# Patient Record
Sex: Male | Born: 1995
Health system: Southern US, Community
[De-identification: ages and names within clinical notes are randomized; demographics above are authoritative.]

## PROBLEM LIST (undated history)

## (undated) DIAGNOSIS — I889 Nonspecific lymphadenitis, unspecified: Principal | ICD-10-CM

## (undated) DIAGNOSIS — A749 Chlamydial infection, unspecified: Secondary | ICD-10-CM

## (undated) DIAGNOSIS — L259 Unspecified contact dermatitis, unspecified cause: Secondary | ICD-10-CM

## (undated) DIAGNOSIS — R634 Abnormal weight loss: Secondary | ICD-10-CM

## (undated) DIAGNOSIS — L239 Allergic contact dermatitis, unspecified cause: Secondary | ICD-10-CM

## (undated) DIAGNOSIS — J45909 Unspecified asthma, uncomplicated: Secondary | ICD-10-CM

## (undated) DIAGNOSIS — Z8619 Personal history of other infectious and parasitic diseases: Secondary | ICD-10-CM

## (undated) DIAGNOSIS — M25512 Pain in left shoulder: Principal | ICD-10-CM

## (undated) DIAGNOSIS — B20 Human immunodeficiency virus [HIV] disease: Principal | ICD-10-CM

## (undated) DIAGNOSIS — T7840XA Allergy, unspecified, initial encounter: Secondary | ICD-10-CM

## (undated) DIAGNOSIS — Z21 Asymptomatic human immunodeficiency virus [HIV] infection status: Secondary | ICD-10-CM

## (undated) HISTORY — DX: Personal history of other infectious and parasitic diseases: Z86.19

## (undated) HISTORY — DX: Allergic contact dermatitis, unspecified cause: L23.9

## (undated) HISTORY — PX: WISDOM TOOTH EXTRACTION: SHX21

## (undated) HISTORY — DX: Human immunodeficiency virus (HIV) disease: B20

## (undated) HISTORY — DX: Pain in left shoulder: M25.512

## (undated) HISTORY — DX: Unspecified asthma, uncomplicated: J45.909

## (undated) HISTORY — DX: Chlamydial infection, unspecified: A74.9

## (undated) HISTORY — DX: Allergy, unspecified, initial encounter: T78.40XA

## (undated) HISTORY — DX: Unspecified contact dermatitis, unspecified cause: L25.9

## (undated) HISTORY — DX: Abnormal weight loss: R63.4

## (undated) HISTORY — DX: Asymptomatic human immunodeficiency virus (hiv) infection status: Z21

## (undated) HISTORY — DX: Nonspecific lymphadenitis, unspecified: I88.9

---
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
## 2014-02-01 ENCOUNTER — Emergency Department (HOSPITAL_COMMUNITY)
Admission: EM | Admit: 2014-02-01 | Discharge: 2014-02-01 | Disposition: A | Discharge status: Home / Self Care | Payer: 59 | Attending: Emergency Medicine | Admitting: Emergency Medicine

## 2014-02-01 ENCOUNTER — Encounter (HOSPITAL_COMMUNITY): Payer: Self-pay | Admitting: *Deleted

## 2014-02-01 DIAGNOSIS — J45909 Unspecified asthma, uncomplicated: Secondary | ICD-10-CM | POA: Diagnosis not present

## 2014-02-01 DIAGNOSIS — R197 Diarrhea, unspecified: Secondary | ICD-10-CM | POA: Diagnosis present

## 2014-02-01 DIAGNOSIS — B349 Viral infection, unspecified: Secondary | ICD-10-CM

## 2014-02-01 HISTORY — DX: Unspecified asthma, uncomplicated: J45.909

## 2014-02-01 LAB — COMPREHENSIVE METABOLIC PANEL
ALT: 21 U/L (ref 0–53)
AST: 31 U/L (ref 0–37)
Albumin: 4 g/dL (ref 3.5–5.2)
Alkaline Phosphatase: 81 U/L (ref 39–117)
Anion gap: 16 — ABNORMAL HIGH (ref 5–15)
BILIRUBIN TOTAL: 1 mg/dL (ref 0.3–1.2)
BUN: 18 mg/dL (ref 6–23)
CHLORIDE: 98 meq/L (ref 96–112)
CO2: 26 meq/L (ref 19–32)
CREATININE: 1.16 mg/dL (ref 0.50–1.35)
Calcium: 8.6 mg/dL (ref 8.4–10.5)
GFR calc Af Amer: >90 mL/min (ref >90)
Glucose, Bld: 96 mg/dL (ref 70–99)
Potassium: 3.9 mEq/L (ref 3.7–5.3)
Sodium: 140 mEq/L (ref 137–147)
Total Protein: 7.6 g/dL (ref 6.0–8.3)

## 2014-02-01 LAB — CBC WITH DIFFERENTIAL/PLATELET
BASOS ABS: 0 10*3/uL (ref 0.0–0.1)
Basophils Relative: 0 % (ref 0–1)
Eosinophils Absolute: 0 10*3/uL (ref 0.0–0.7)
Eosinophils Relative: 0 % (ref 0–5)
HEMATOCRIT: 48.2 % (ref 39.0–52.0)
HEMOGLOBIN: 16.4 g/dL (ref 13.0–17.0)
LYMPHS ABS: 2.3 10*3/uL (ref 0.7–4.0)
LYMPHS PCT: 55 % — AB (ref 12–46)
MCH: 29.8 pg (ref 26.0–34.0)
MCHC: 34 g/dL (ref 30.0–36.0)
MCV: 87.6 fL (ref 78.0–100.0)
MONO ABS: 0.3 10*3/uL (ref 0.1–1.0)
MONOS PCT: 6 % (ref 3–12)
NEUTROS ABS: 1.7 10*3/uL (ref 1.7–7.7)
Neutrophils Relative %: 39 % — ABNORMAL LOW (ref 43–77)
Platelets: 167 10*3/uL (ref 150–400)
RBC: 5.5 MIL/uL (ref 4.22–5.81)
RDW: 13.7 % (ref 11.5–15.5)
WBC: 4.3 10*3/uL (ref 4.0–10.5)

## 2014-02-01 LAB — RAPID STREP SCREEN (MED CTR MEBANE ONLY): Streptococcus, Group A Screen (Direct): NEGATIVE

## 2014-02-01 LAB — LIPASE, BLOOD: Lipase: 55 U/L (ref 11–59)

## 2014-02-01 LAB — URINALYSIS, ROUTINE W REFLEX MICROSCOPIC
Bilirubin Urine: NEGATIVE
GLUCOSE, UA: NEGATIVE mg/dL
Hgb urine dipstick: NEGATIVE
KETONES UR: 15 mg/dL — AB
LEUKOCYTES UA: NEGATIVE
Nitrite: NEGATIVE
PROTEIN: 100 mg/dL — AB
Specific Gravity, Urine: 1.036 — ABNORMAL HIGH (ref 1.005–1.030)
Urobilinogen, UA: 0.2 mg/dL (ref 0.0–1.0)
pH: 6 (ref 5.0–8.0)

## 2014-02-01 LAB — URINE MICROSCOPIC-ADD ON

## 2014-02-01 MED ORDER — KETOROLAC TROMETHAMINE 30 MG/ML IJ SOLN
30.0000 mg | Freq: Once | INTRAMUSCULAR | Status: AC
  Administered 2014-02-01: 30 mg via INTRAVENOUS
  Filled 2014-02-01: qty 1

## 2014-02-01 MED ORDER — IBUPROFEN 800 MG PO TABS: 800.0000 mg | ORAL_TABLET | Freq: Three times a day (TID) | ORAL | Status: DC

## 2014-02-01 MED ORDER — ONDANSETRON 4 MG PO TBDP: 4.0000 mg | ORAL_TABLET | Freq: Every day | ORAL | Status: DC | PRN

## 2014-02-01 MED ORDER — ONDANSETRON HCL 4 MG/2ML IJ SOLN
4.0000 mg | Freq: Once | INTRAMUSCULAR | Status: AC
  Administered 2014-02-01: 4 mg via INTRAVENOUS
  Filled 2014-02-01: qty 2

## 2014-02-01 NOTE — ED Notes (Signed)
Pt reports headache, nasal congestion, fever of 102 at home. Pt reports diarrhea x 3 and constipation. Last BM today and normal. Pt reports one BM a day. Reports emesis x 4 in three days. Pt took 1 g of tylenol around 2100 tonight.

## 2014-02-01 NOTE — ED Provider Notes (Signed)
CSN: [id number]     Arrival date & time 02/01/14  0005 History   First MD Initiated Contact with Patient 02/01/14 [phone]     Chief Complaint  Patient presents with  . Headache  . Nasal Congestion  . Emesis  . Diarrhea     (Consider location/radiation/quality/duration/timing/severity/associated sxs/prior Treatment) HPI   [name] is a 18 year old male coming in with multiple complaints. Patient states the last 3 days he's had abdominal pain, headache, fever, nausea, vomiting, diarrhea. Patient's denying any cough or sick contacts. He states his fevers as high as 102. His abdominal pain is diffuse and he describes it as nausea. There is no radiation. He's had about 2 episodes of vomiting per day.  He has had loose stools as well. He denies any dysuria or hematuria or penile discharge. Patient overall does not feel well, he has no further complaints.  10 Systems reviewed and are negative for acute change except as noted in the HPI.   Past Medical History  Diagnosis Date  . Asthma    No past surgical history on file. No family history on file. History  Substance Use Topics  . Smoking status: Never Smoker   . Smokeless tobacco: Never Used  . Alcohol Use: No    Review of Systems    Allergies  Review of patient's allergies indicates no known allergies.  Home Medications   Prior to Admission medications   Not on File   BP 109/68 mmHg  Pulse 86  Temp(Src) 99 F (37.2 C) (Oral)  Resp 14  Ht 5\' 7"  (1.702 m)  Wt 154 lb (69.854 kg)  BMI 24.11 kg/m2  SpO2 97% Physical Exam  Constitutional: He is oriented to person, place, and time. Vital signs are normal. He appears well-developed and well-nourished.  Non-toxic appearance. He does not appear ill. No distress.  HENT:  Head: Normocephalic and atraumatic.  Nose: Nose normal.  Mouth/Throat: Oropharynx is clear and moist. No oropharyngeal exudate.  Eyes: Conjunctivae and EOM are normal. Pupils are equal, round, and reactive  to light. No scleral icterus.  Neck: Normal range of motion. Neck supple. No tracheal deviation, no edema, no erythema and normal range of motion present. No thyroid mass and no thyromegaly present.  Cardiovascular: Normal rate, regular rhythm, S1 normal, S2 normal, normal heart sounds, intact distal pulses and normal pulses.  Exam reveals no gallop and no friction rub.   No murmur heard. Pulses:      Radial pulses are 2+ on the right side, and 2+ on the left side.       Dorsalis pedis pulses are 2+ on the right side, and 2+ on the left side.  Pulmonary/Chest: Effort normal and breath sounds normal. No respiratory distress. He has no wheezes. He has no rhonchi. He has no rales.  Abdominal: Soft. Normal appearance and bowel sounds are normal. He exhibits no distension, no ascites and no mass. There is no hepatosplenomegaly. There is no tenderness. There is no rebound, no guarding and no CVA tenderness.  Musculoskeletal: Normal range of motion. He exhibits no edema or tenderness.  Lymphadenopathy:    He has no cervical adenopathy.  Neurological: He is alert and oriented to person, place, and time. He has normal strength. No cranial nerve deficit or sensory deficit. He exhibits normal muscle tone. GCS eye subscore is 4. GCS verbal subscore is 5. GCS motor subscore is 6.  Skin: Skin is warm, dry and intact. No petechiae and no rash noted. He is not  diaphoretic. No erythema. No pallor.  Psychiatric: He has a normal mood and affect. His behavior is normal. Judgment normal.  Nursing note and vitals reviewed.   ED Course  Procedures (including critical care time) Labs Review Labs Reviewed  CBC WITH DIFFERENTIAL - Abnormal; Notable for the following:    Neutrophils Relative % 39 (*)    Lymphocytes Relative 55 (*)    All other components within normal limits  COMPREHENSIVE METABOLIC PANEL - Abnormal; Notable for the following:    Anion gap 16 (*)    All other components within normal limits   URINALYSIS, ROUTINE W REFLEX MICROSCOPIC - Abnormal; Notable for the following:    Color, Urine AMBER (*)    Specific Gravity, Urine 1.036 (*)    Ketones, ur 15 (*)    Protein, ur 100 (*)    All other components within normal limits  URINE MICROSCOPIC-ADD ON - Abnormal; Notable for the following:    Bacteria, UA FEW (*)    All other components within normal limits  RAPID STREP SCREEN  CULTURE, GROUP A STREP  LIPASE, BLOOD    Imaging Review No results found.   EKG Interpretation   Date/Time:  Saturday February 01 2014 02:17:21 EST Ventricular Rate:  86 PR Interval:  161 QRS Duration: 95 QT Interval:  360 QTC Calculation: 430 R Axis:   84 Text Interpretation:  Sinus rhythm RSR' in V1 or V2, probably normal  variant ST elev, probable normal early repol pattern T wave inversion lead  3 Confirmed by Erroll Lunani, Jermale Crass Ayokunle [phone]([id number]) on 02/01/2014 2:51:02 AM      MDM   Final diagnoses:  None    Patient since emergency department for multiple complaints. This is likely a viral syndrome. Blood work was obtained which did not reveal any acute cause of his symptoms. Given Toradol and Zofran emergency department for treatment. I checked his temperature in the room and it was 100.5.  Upon my repeat evaluation the patient states his symptoms has improved, nausea is resolved. He is no longer febrile. He was told this is likely a viral syndrome he needs to follow-up with the primary care physician within 3 days.His vital signs remain within normal limits and he is safe for discharge.    Tomasita CrumbleAdeleke Arbutus Nelligan, MD 02/01/14 [phone]

## 2014-02-01 NOTE — Discharge Instructions (Signed)
Viral Infections [name], You were seen today for multiple complaints. Family viral syndrome. Follow-up with a primary care physician within 3 days for continued treatment.continue to take Tylenol and Motrin as needed at home for fever and pain. If any of your symptoms worsen come back to the emergency department immediately for repeat evaluation. Thank you. A virus is a type of germ. Viruses can cause:  Minor sore throats.  Aches and pains.  Headaches.  Runny nose.  Rashes.  Watery eyes.  Tiredness.  Coughs.  Loss of appetite.  Feeling sick to your stomach (nausea).  Throwing up (vomiting).  Watery poop (diarrhea). HOME CARE   Only take medicines as told by your doctor.  Drink enough water and fluids to keep your pee (urine) clear or pale yellow. Sports drinks are a good choice.  Get plenty of rest and eat healthy. Soups and broths with crackers or rice are fine. GET HELP RIGHT AWAY IF:   You have a very bad headache.  You have shortness of breath.  You have chest pain or neck pain.  You have an unusual rash.  You cannot stop throwing up.  You have watery poop that does not stop.  You cannot keep fluids down.  You or your child has a temperature by mouth above 102 F (38.9 C), not controlled by medicine.  Your baby is older than 3 months with a rectal temperature of 102 F (38.9 C) or higher.  Your baby is 463 months old or younger with a rectal temperature of 100.4 F (38 C) or higher. MAKE SURE YOU:   Understand these instructions.  Will watch this condition.  Will get help right away if you are not doing well or get worse. Document Released: 02/18/2008 Document Revised: 05/30/2011 Document Reviewed: 07/13/2010 [facility] Patient Information 2015 [employer], [place]. This information is not intended to replace advice given to you by your health care provider. Make sure you discuss any questions you have with your health care provider.

## 2014-02-03 ENCOUNTER — Emergency Department (HOSPITAL_COMMUNITY): Payer: 59

## 2014-02-03 ENCOUNTER — Encounter (HOSPITAL_COMMUNITY): Payer: Self-pay | Admitting: *Deleted

## 2014-02-03 ENCOUNTER — Emergency Department (HOSPITAL_COMMUNITY)
Admission: EM | Admit: 2014-02-03 | Discharge: 2014-02-03 | Disposition: A | Discharge status: Home / Self Care | Payer: 59 | Attending: Emergency Medicine | Admitting: Emergency Medicine

## 2014-02-03 DIAGNOSIS — R112 Nausea with vomiting, unspecified: Secondary | ICD-10-CM | POA: Diagnosis present

## 2014-02-03 DIAGNOSIS — Z791 Long term (current) use of non-steroidal anti-inflammatories (NSAID): Secondary | ICD-10-CM | POA: Insufficient documentation

## 2014-02-03 DIAGNOSIS — J159 Unspecified bacterial pneumonia: Secondary | ICD-10-CM | POA: Insufficient documentation

## 2014-02-03 DIAGNOSIS — J45909 Unspecified asthma, uncomplicated: Secondary | ICD-10-CM | POA: Diagnosis not present

## 2014-02-03 DIAGNOSIS — Z79899 Other long term (current) drug therapy: Secondary | ICD-10-CM | POA: Diagnosis not present

## 2014-02-03 DIAGNOSIS — R05 Cough: Secondary | ICD-10-CM

## 2014-02-03 DIAGNOSIS — J189 Pneumonia, unspecified organism: Secondary | ICD-10-CM

## 2014-02-03 DIAGNOSIS — R059 Cough, unspecified: Secondary | ICD-10-CM

## 2014-02-03 LAB — BASIC METABOLIC PANEL
ANION GAP: 14 (ref 5–15)
BUN: 16 mg/dL (ref 6–23)
CALCIUM: 8.7 mg/dL (ref 8.4–10.5)
CO2: 24 mEq/L (ref 19–32)
CREATININE: 0.96 mg/dL (ref 0.50–1.35)
Chloride: 99 mEq/L (ref 96–112)
GFR calc non Af Amer: >90 mL/min (ref >90)
Glucose, Bld: 94 mg/dL (ref 70–99)
Potassium: 4.2 mEq/L (ref 3.7–5.3)
Sodium: 137 mEq/L (ref 137–147)

## 2014-02-03 LAB — CBC WITH DIFFERENTIAL/PLATELET
BASOS ABS: 0.1 10*3/uL (ref 0.0–0.1)
BASOS PCT: 2 % — AB (ref 0–1)
EOS ABS: 0 10*3/uL (ref 0.0–0.7)
EOS PCT: 0 % (ref 0–5)
HEMATOCRIT: 46.8 % (ref 39.0–52.0)
Hemoglobin: 16.2 g/dL (ref 13.0–17.0)
Lymphocytes Relative: 50 % — ABNORMAL HIGH (ref 12–46)
Lymphs Abs: 2.3 10*3/uL (ref 0.7–4.0)
MCH: 29.2 pg (ref 26.0–34.0)
MCHC: 34.6 g/dL (ref 30.0–36.0)
MCV: 84.5 fL (ref 78.0–100.0)
MONO ABS: 0.4 10*3/uL (ref 0.1–1.0)
Monocytes Relative: 9 % (ref 3–12)
Neutro Abs: 1.8 10*3/uL (ref 1.7–7.7)
Neutrophils Relative %: 39 % — ABNORMAL LOW (ref 43–77)
Platelets: 116 10*3/uL — ABNORMAL LOW (ref 150–400)
RBC: 5.54 MIL/uL (ref 4.22–5.81)
RDW: 13.6 % (ref 11.5–15.5)
WBC: 4.6 10*3/uL (ref 4.0–10.5)

## 2014-02-03 LAB — CULTURE, GROUP A STREP

## 2014-02-03 MED ORDER — METOCLOPRAMIDE HCL 5 MG/ML IJ SOLN
10.0000 mg | Freq: Once | INTRAMUSCULAR | Status: AC
  Administered 2014-02-03: 10 mg via INTRAVENOUS
  Filled 2014-02-03: qty 2

## 2014-02-03 MED ORDER — SODIUM CHLORIDE 0.9 % IV BOLUS (SEPSIS): 1000.0000 mL | Freq: Once | INTRAVENOUS | Status: DC

## 2014-02-03 MED ORDER — SODIUM CHLORIDE 0.9 % IV BOLUS (SEPSIS)
1000.0000 mL | Freq: Once | INTRAVENOUS | Status: AC
  Administered 2014-02-03: 1000 mL via INTRAVENOUS

## 2014-02-03 MED ORDER — METOCLOPRAMIDE HCL 10 MG PO TABS: 10.0000 mg | ORAL_TABLET | Freq: Four times a day (QID) | ORAL | Status: DC | PRN

## 2014-02-03 MED ORDER — AZITHROMYCIN 250 MG PO TABS
500.0000 mg | ORAL_TABLET | Freq: Once | ORAL | Status: AC
  Administered 2014-02-03: 500 mg via ORAL
  Filled 2014-02-03: qty 2

## 2014-02-03 MED ORDER — AZITHROMYCIN 250 MG PO TABS: 250.0000 mg | ORAL_TABLET | Freq: Every day | ORAL | Status: DC

## 2014-02-03 MED ORDER — DEXTROSE 5 % IV SOLN
1.0000 g | Freq: Once | INTRAVENOUS | Status: AC
  Administered 2014-02-03: 1 g via INTRAVENOUS
  Filled 2014-02-03: qty 10

## 2014-02-03 NOTE — ED Notes (Signed)
PA Rob at the bedside 

## 2014-02-03 NOTE — ED Provider Notes (Signed)
CSN: [id number]     Arrival date & time 02/03/14  1623 History   First MD Initiated Contact with Patient 02/03/14 1809     Chief Complaint  Patient presents with  . Emesis     (Consider location/radiation/quality/duration/timing/severity/associated sxs/prior Treatment) HPI Comments: Patient with history of asthma, presents to the emergency department with chief complaints of cough, nausea, and vomiting. Patient states that he was recently seen and diagnosed with URI. Patient states that stay started to notice blood streaks in his vomit. He also reports persistent cough. He endorses fever to 102. He has not taken anything to alleviate his symptoms. There are no aggravating or alleviating factors.   The history is provided by the patient. No language interpreter was used.    Past Medical History  Diagnosis Date  . Asthma    History reviewed. No pertinent past surgical history. History reviewed. No pertinent family history. History  Substance Use Topics  . Smoking status: Never Smoker   . Smokeless tobacco: Never Used  . Alcohol Use: No    Review of Systems  Constitutional: Positive for fever and chills.  HENT: Positive for postnasal drip, rhinorrhea, sinus pressure, sneezing and sore throat.   Respiratory: Positive for cough. Negative for shortness of breath.   Cardiovascular: Negative for chest pain.  Gastrointestinal: Positive for nausea and vomiting. Negative for abdominal pain, diarrhea and constipation.  Genitourinary: Negative for dysuria.  All other systems reviewed and are negative.     Allergies  Review of patient's allergies indicates no known allergies.  Home Medications   Prior to Admission medications   Medication Sig Start Date End Date Taking? Authorizing Provider  ibuprofen (ADVIL,MOTRIN) 800 MG tablet Take 1 tablet (800 mg total) by mouth 3 (three) times daily. 02/01/14  Yes Tomasita Crumble, MD  mometasone (ASMANEX) 220 MCG/INH inhaler Inhale 2 puffs into  the lungs daily as needed (shortness of breath).   Yes Historical Provider, MD  montelukast (SINGULAIR) 10 MG tablet Take 10 mg by mouth at bedtime.   Yes Historical Provider, MD  ondansetron (ZOFRAN ODT) 4 MG disintegrating tablet Take 1 tablet (4 mg total) by mouth daily as needed for nausea. 02/01/14  Yes Tomasita Crumble, MD  cetirizine (ZYRTEC) 10 MG tablet Take 10 mg by mouth daily.    Historical Provider, MD   BP 94/64 mmHg  Pulse 87  Temp(Src) 98.1 F (36.7 C) (Oral)  Resp 15  SpO2 94% Physical Exam  Constitutional: He is oriented to person, place, and time. He appears well-developed and well-nourished.  HENT:  Head: Normocephalic and atraumatic.  Oropharynx is clear, TMs are clear bilaterally  Eyes: Conjunctivae and EOM are normal. Pupils are equal, round, and reactive to light. Right eye exhibits no discharge. Left eye exhibits no discharge. No scleral icterus.  Neck: Normal range of motion. Neck supple. No JVD present.  Cardiovascular: Normal rate, regular rhythm and normal heart sounds.  Exam reveals no gallop and no friction rub.   No murmur heard. Pulmonary/Chest: Effort normal and breath sounds normal. No respiratory distress. He has no wheezes. He has no rales. He exhibits no tenderness.  Clear to auscultation bilaterally  Abdominal: Soft. He exhibits no distension and no mass. There is no tenderness. There is no rebound and no guarding.  No focal abdominal tenderness, no RLQ tenderness or pain at McBurney's point, no RUQ tenderness or Murphy's sign, no left-sided abdominal tenderness, no fluid wave, or signs of peritonitis   Musculoskeletal: Normal range of motion. He exhibits  no edema or tenderness.  Neurological: He is alert and oriented to person, place, and time.  Skin: Skin is warm and dry.  Psychiatric: He has a normal mood and affect. His behavior is normal. Judgment and thought content normal.  Nursing note and vitals reviewed.   ED Course  Procedures (including  critical care time) Labs Review Results for orders placed or performed during the hospital encounter of 02/03/14  CBC with Differential  Result Value Ref Range   WBC 4.6 4.0 - 10.5 K/uL   RBC 5.54 4.22 - 5.81 MIL/uL   Hemoglobin 16.2 13.0 - 17.0 g/dL   HCT 16.146.8 09.639.0 - 04.552.0 %   MCV 84.5 78.0 - 100.0 fL   MCH 29.2 26.0 - 34.0 pg   MCHC 34.6 30.0 - 36.0 g/dL   RDW 40.913.6 81.111.5 - 91.415.5 %   Platelets 116 (L) 150 - 400 K/uL   Neutrophils Relative % 39 (L) 43 - 77 %   Neutro Abs 1.8 1.7 - 7.7 K/uL   Lymphocytes Relative 50 (H) 12 - 46 %   Lymphs Abs 2.3 0.7 - 4.0 K/uL   Monocytes Relative 9 3 - 12 %   Monocytes Absolute 0.4 0.1 - 1.0 K/uL   Eosinophils Relative 0 0 - 5 %   Eosinophils Absolute 0.0 0.0 - 0.7 K/uL   Basophils Relative 2 (H) 0 - 1 %   Basophils Absolute 0.1 0.0 - 0.1 K/uL  Basic metabolic panel  Result Value Ref Range   Sodium 137 137 - 147 mEq/L   Potassium 4.2 3.7 - 5.3 mEq/L   Chloride 99 96 - 112 mEq/L   CO2 24 19 - 32 mEq/L   Glucose, Bld 94 70 - 99 mg/dL   BUN 16 6 - 23 mg/dL   Creatinine, Ser 7.820.96 0.50 - 1.35 mg/dL   Calcium 8.7 8.4 - 95.610.5 mg/dL   GFR calc non Af Amer >90 >90 mL/min   GFR calc Af Amer >90 >90 mL/min   Anion gap 14 5 - 15   Dg Chest 2 View  02/03/2014   CLINICAL DATA:  [age] year old male with shortness of breath cough and hematemesis. left ear pain when swallowing. Recent viral syndrome. Initial encounter.  EXAM: CHEST  2 VIEW  COMPARISON:  11/30/2009.  FINDINGS: They 8 and streaky bilateral peribronchovascular opacity best seen on the PA view. No superimposed consolidation or pleural effusion. No pneumothorax or pulmonary edema. Normal cardiac size and mediastinal contours. Visualized tracheal air column is within normal limits. No pneumoperitoneum and negative visible bowel gas pattern. No osseous abnormality identified.  IMPRESSION: Vague increased bilateral peribronchovascular opacity compatible with bilateral bronchopneumonia. No associated pleural  effusion.   Electronically Signed   By: Augusto GambleLee  Hall M.D.   On: 02/03/2014 16:57     Imaging Review Dg Chest 2 View  02/03/2014   CLINICAL DATA:  18 year old male with shortness of breath cough and hematemesis. left ear pain when swallowing. Recent viral syndrome. Initial encounter.  EXAM: CHEST  2 VIEW  COMPARISON:  11/30/2009.  FINDINGS: They 8 and streaky bilateral peribronchovascular opacity best seen on the PA view. No superimposed consolidation or pleural effusion. No pneumothorax or pulmonary edema. Normal cardiac size and mediastinal contours. Visualized tracheal air column is within normal limits. No pneumoperitoneum and negative visible bowel gas pattern. No osseous abnormality identified.  IMPRESSION: Vague increased bilateral peribronchovascular opacity compatible with bilateral bronchopneumonia. No associated pleural effusion.   Electronically Signed   By: Si GaulLee  Hall M.D.  On: 02/03/2014 16:57     EKG Interpretation None      MDM   Final diagnoses:  CAP (community acquired pneumonia)    Patient with community-acquired pneumonia. Treated in ED with ceftriaxone and azithromycin. Will discharge home with Z-Pak. Return fashion skin. Patient maintains greater than 90% O2 saturation with ambulation. He is afebrile. Vital signs are stable. He is tolerating oral intake. Patient is stable and ready for discharge. He is low risk for outpatient treatment.   Roxy Horsemanobert Teller [place], PA-C 02/03/14 2007  Enid SkeensJoshua M Zavitz, MD 02/03/14 2016

## 2014-02-03 NOTE — Discharge Instructions (Signed)

## 2014-02-03 NOTE — ED Notes (Addendum)
Pt in c/o URI over the last few days, today he started noticing blood streaks in his vomiting, seen for this on 11/14, pt also reports a cough

## 2014-07-11 ENCOUNTER — Telehealth: Payer: Self-pay

## 2014-07-11 NOTE — Telephone Encounter (Signed)
Patient contacted regarding new intake appointment. Date and time given. Information given regarding documents needed to qualify for financial eligibility.  Kashton Mcartor K Izzabell Klasen, RN  

## 2014-07-22 ENCOUNTER — Other Ambulatory Visit: Payer: Self-pay | Admitting: Infectious Disease

## 2014-07-22 ENCOUNTER — Ambulatory Visit: Payer: 59

## 2014-07-22 DIAGNOSIS — B20 Human immunodeficiency virus [HIV] disease: Secondary | ICD-10-CM

## 2014-07-23 ENCOUNTER — Telehealth: Payer: Self-pay | Admitting: Infectious Disease

## 2014-07-23 DIAGNOSIS — B20 Human immunodeficiency virus [HIV] disease: Secondary | ICD-10-CM

## 2014-07-23 LAB — CBC WITH DIFFERENTIAL/PLATELET
BASOS ABS: 0.1 10*3/uL (ref 0.0–0.1)
Basophils Relative: 1 % (ref 0–1)
Eosinophils Absolute: 0.6 10*3/uL (ref 0.0–0.7)
Eosinophils Relative: 7 % — ABNORMAL HIGH (ref 0–5)
HCT: 43.9 % (ref 39.0–52.0)
HEMOGLOBIN: 14.8 g/dL (ref 13.0–17.0)
LYMPHS PCT: 38 % (ref 12–46)
Lymphs Abs: 3.3 10*3/uL (ref 0.7–4.0)
MCH: 28.3 pg (ref 26.0–34.0)
MCHC: 33.7 g/dL (ref 30.0–36.0)
MCV: 83.9 fL (ref 78.0–100.0)
MONO ABS: 0.9 10*3/uL (ref 0.1–1.0)
MPV: 10 fL (ref 8.6–12.4)
Monocytes Relative: 10 % (ref 3–12)
NEUTROS ABS: 3.8 10*3/uL (ref 1.7–7.7)
Neutrophils Relative %: 44 % (ref 43–77)
Platelets: 267 10*3/uL (ref 150–400)
RBC: 5.23 MIL/uL (ref 4.22–5.81)
RDW: 14.3 % (ref 11.5–15.5)
WBC: 8.7 10*3/uL (ref 4.0–10.5)

## 2014-07-23 LAB — LIPID PANEL
Cholesterol: 92 mg/dL (ref 0–200)
HDL: 16 mg/dL — ABNORMAL LOW (ref >=40)
Total CHOL/HDL Ratio: 5.8 Ratio
Triglycerides: 375 mg/dL — ABNORMAL HIGH (ref <150)
VLDL: 75 mg/dL — ABNORMAL HIGH (ref 0–40)

## 2014-07-23 LAB — HEPATITIS A ANTIBODY, TOTAL: Hep A Total Ab: REACTIVE — AB

## 2014-07-23 LAB — URINALYSIS
BILIRUBIN URINE: NEGATIVE
Glucose, UA: NEGATIVE mg/dL
Hgb urine dipstick: NEGATIVE
Ketones, ur: NEGATIVE mg/dL
Leukocytes, UA: NEGATIVE
NITRITE: NEGATIVE
PROTEIN: NEGATIVE mg/dL
SPECIFIC GRAVITY, URINE: 1.009 (ref 1.005–1.030)
UROBILINOGEN UA: 0.2 mg/dL (ref 0.0–1.0)
pH: 6.5 (ref 5.0–8.0)

## 2014-07-23 LAB — COMPLETE METABOLIC PANEL WITH GFR
ALT: 33 U/L (ref 0–53)
AST: 31 U/L (ref 0–37)
Albumin: 4 g/dL (ref 3.5–5.2)
Alkaline Phosphatase: 82 U/L (ref 39–117)
BILIRUBIN TOTAL: 0.6 mg/dL (ref 0.2–1.2)
BUN: 13 mg/dL (ref 6–23)
CO2: 27 meq/L (ref 19–32)
CREATININE: 0.67 mg/dL (ref 0.50–1.35)
Calcium: 8.8 mg/dL (ref 8.4–10.5)
Chloride: 104 mEq/L (ref 96–112)
GFR, Est Non African American: >89 mL/min
Glucose, Bld: 79 mg/dL (ref 70–99)
Potassium: 3.9 mEq/L (ref 3.5–5.3)
Sodium: 141 mEq/L (ref 135–145)
Total Protein: 7.2 g/dL (ref 6.0–8.3)

## 2014-07-23 LAB — HEPATITIS B SURFACE ANTIBODY,QUALITATIVE: Hep B S Ab: POSITIVE — AB

## 2014-07-23 LAB — HEPATITIS C ANTIBODY: HCV Ab: NEGATIVE

## 2014-07-23 LAB — HEPATITIS B SURFACE ANTIGEN: HEP B S AG: NEGATIVE

## 2014-07-23 LAB — HIV-1 RNA ULTRAQUANT REFLEX TO GENTYP+
HIV 1 RNA Quant: 4005643 copies/mL — ABNORMAL HIGH (ref <20)
HIV-1 RNA Quant, Log: 6.6 {Log} — ABNORMAL HIGH (ref <1.30)

## 2014-07-23 LAB — HEPATITIS B CORE ANTIBODY, TOTAL: HEP B C TOTAL AB: NONREACTIVE

## 2014-07-23 LAB — URINE CYTOLOGY ANCILLARY ONLY
Chlamydia: NEGATIVE
NEISSERIA GONORRHEA: NEGATIVE

## 2014-07-23 LAB — RPR

## 2014-07-23 NOTE — Telephone Encounter (Signed)
Can we add an HIV integrase genotype to patients labs?  He is with extremely HIGH Viral load with VL in millions.  Did he come through the [facility]?

## 2014-07-23 NOTE — Addendum Note (Signed)
Addended by: Mariea ClontsGREEN, Addalie Calles D on: 07/23/2014 02:42 PM   Modules accepted: Orders

## 2014-07-23 NOTE — Addendum Note (Signed)
Addended by: Mariea ClontsGREEN, Ayodele Sangalang D on: 07/23/2014 03:02 PM   Modules accepted: Orders

## 2014-07-24 LAB — T-HELPER CELL (CD4) - (RCID CLINIC ONLY)
CD4 % Helper T Cell: 15 % — ABNORMAL LOW (ref 33–55)
CD4 T Cell Abs: 510 /uL (ref 400–2700)

## 2014-07-24 NOTE — Addendum Note (Signed)
Addended by: Mariea ClontsGREEN, Dannette Kinkaid D on: 07/24/2014 03:04 PM   Modules accepted: Orders

## 2014-07-24 NOTE — Telephone Encounter (Signed)
Tamika can you bring me his records to look at this afternoon. I want to get a better handle on whether this is an acute case or not--if it is then he needs to be put on meds ASAP. If not we could potentially put him into one of the [place]

## 2014-07-24 NOTE — Telephone Encounter (Signed)
Yes he was a new intake

## 2014-07-25 LAB — QUANTIFERON TB GOLD ASSAY (BLOOD)
Interferon Gamma Release Assay: NEGATIVE
Mitogen value: 9.33 IU/mL
Quantiferon Nil Value: 0.11 IU/mL
Quantiferon Tb Ag Minus Nil Value: 0 IU/mL
TB Ag value: 0.11 IU/mL

## 2014-07-25 LAB — HLA B*5701: HLA-B*5701 w/rflx HLA-B High: NEGATIVE

## 2014-08-02 LAB — HIV-1 INTEGRASE GENOTYPE

## 2014-08-03 LAB — HIV-1 GENOTYPR PLUS

## 2014-08-06 ENCOUNTER — Encounter: Payer: Self-pay | Admitting: *Deleted

## 2014-08-06 ENCOUNTER — Encounter: Payer: Self-pay | Admitting: Infectious Disease

## 2014-08-06 ENCOUNTER — Ambulatory Visit (INDEPENDENT_AMBULATORY_CARE_PROVIDER_SITE_OTHER): Payer: 59 | Admitting: Infectious Disease

## 2014-08-06 VITALS — BP 124/82 | HR 85 | Temp 97.7°F | Wt 141.0 lb

## 2014-08-06 DIAGNOSIS — Z23 Encounter for immunization: Secondary | ICD-10-CM | POA: Diagnosis not present

## 2014-08-06 DIAGNOSIS — J4520 Mild intermittent asthma, uncomplicated: Secondary | ICD-10-CM

## 2014-08-06 DIAGNOSIS — J45909 Unspecified asthma, uncomplicated: Secondary | ICD-10-CM

## 2014-08-06 DIAGNOSIS — B20 Human immunodeficiency virus [HIV] disease: Secondary | ICD-10-CM | POA: Diagnosis not present

## 2014-08-06 HISTORY — DX: Human immunodeficiency virus (HIV) disease: B20

## 2014-08-06 HISTORY — DX: Unspecified asthma, uncomplicated: J45.909

## 2014-08-06 MED ORDER — ABACAVIR-DOLUTEGRAVIR-LAMIVUD 600-50-300 MG PO TABS: 1.0000 | ORAL_TABLET | Freq: Every day | ORAL | Status: DC

## 2014-08-06 NOTE — Progress Notes (Signed)
Subjective:    Patient ID: ADETOKUNBO MCCADDEN, male    DOB: [birth year], 19 y.o.   MRN: [id number]  HPI  Mr. Blaylock is a 19 year old African-American male who has been recently diagnosed with HIV disease. It appears he was seen in November for a flulike illness but not tested for HIV at that time he was tested ultimately in April and found to be HIV by fourth generation assay with discriminatory test being positive. Here in our clinic his viral load was found to be above 4 million copies per milliliter of blood. CD4 count was still healthy above 500.  His HIV integrase genotype was negative for mutations as was his conventional genotype. He is HLA [id number] was negative and he does not have hepatitis B.  We had extensive discussions with regards to antiretroviral drugs today. I gave him a comprehensive and exhaustive explanation and review of 2 potential clinical trials offered via [facility] that that he is eligible for. He was interested in both of these studies but he did not want to wait the likely 2 weeks it would take 4 screening labs to come back and 1-2 weeks after his being seen for entry. Instead he wished to start on anti-retroviral medications today which I think is highly understandable given in particular how high his viral load is.   Review of Systems  Constitutional: Negative for fever, chills, diaphoresis, activity change, appetite change, fatigue and unexpected weight change.  HENT: Negative for congestion, rhinorrhea, sinus pressure, sneezing, sore throat and trouble swallowing.   Eyes: Negative for photophobia and visual disturbance.  Respiratory: Negative for cough, chest tightness, shortness of breath, wheezing and stridor.   Cardiovascular: Negative for chest pain, palpitations and leg swelling.  Gastrointestinal: Negative for nausea, vomiting, abdominal pain, diarrhea, constipation, blood in stool, abdominal distention and anal bleeding.  Genitourinary: Negative for dysuria,  hematuria, flank pain and difficulty urinating.  Musculoskeletal: Negative for myalgias, back pain, joint swelling, arthralgias and gait problem.  Skin: Negative for color change, pallor, rash and wound.  Neurological: Negative for dizziness, tremors, weakness and light-headedness.  Hematological: Negative for adenopathy. Does not bruise/bleed easily.  Psychiatric/Behavioral: Negative for behavioral problems, confusion, sleep disturbance, dysphoric mood, decreased concentration and agitation.       Objective:   Physical Exam  Constitutional: He is oriented to person, place, and time. He appears well-developed and well-nourished.  HENT:  Head: Normocephalic and atraumatic.  Eyes: Conjunctivae and EOM are normal.  Neck: Normal range of motion. Neck supple.  Cardiovascular: Normal rate and regular rhythm.   Pulmonary/Chest: Effort normal. No respiratory distress. He has no wheezes.  Abdominal: Soft. He exhibits no distension.  Musculoskeletal: Normal range of motion. He exhibits no edema or tenderness.  Neurological: He is alert and oriented to person, place, and time.  Skin: Skin is warm and dry. No rash noted. No erythema. No pallor.  Psychiatric: He has a normal mood and affect. His behavior is normal. Judgment and thought content normal.          Assessment & Plan:   HIV disease: Start TRIUMEQ bring back in one month's time for recheck viral load and CD4 count. I spent greater than 60 minutes with the patient including greater than 50% of time in face to face counsel of the patient regarding the nature of HIV different antiretrovirals including pros and cons of different medications including new medications that would be available via the 2 [facility] clinical trials and in coordination of their care.  Asthma: Mild intermittent and not requiring corticosteroids or daily bronchodilators.

## 2014-08-28 ENCOUNTER — Other Ambulatory Visit: Payer: Self-pay | Admitting: Family Medicine

## 2014-09-10 ENCOUNTER — Other Ambulatory Visit: Payer: 59

## 2014-09-10 DIAGNOSIS — B20 Human immunodeficiency virus [HIV] disease: Secondary | ICD-10-CM

## 2014-09-10 LAB — CBC WITH DIFFERENTIAL/PLATELET
BASOS ABS: 0.1 10*3/uL (ref 0.0–0.1)
BASOS PCT: 1 % (ref 0–1)
EOS ABS: 2.2 10*3/uL — AB (ref 0.0–0.7)
Eosinophils Relative: 23 % — ABNORMAL HIGH (ref 0–5)
HCT: 41.2 % (ref 39.0–52.0)
Hemoglobin: 13.7 g/dL (ref 13.0–17.0)
Lymphocytes Relative: 45 % (ref 12–46)
Lymphs Abs: 4.4 10*3/uL — ABNORMAL HIGH (ref 0.7–4.0)
MCH: 28.6 pg (ref 26.0–34.0)
MCHC: 33.3 g/dL (ref 30.0–36.0)
MCV: 86 fL (ref 78.0–100.0)
MONOS PCT: 6 % (ref 3–12)
MPV: 9.5 fL (ref 8.6–12.4)
Monocytes Absolute: 0.6 10*3/uL (ref 0.1–1.0)
NEUTROS ABS: 2.4 10*3/uL (ref 1.7–7.7)
NEUTROS PCT: 25 % — AB (ref 43–77)
Platelets: 316 10*3/uL (ref 150–400)
RBC: 4.79 MIL/uL (ref 4.22–5.81)
RDW: 15.7 % — ABNORMAL HIGH (ref 11.5–15.5)
WBC: 9.7 10*3/uL (ref 4.0–10.5)

## 2014-09-10 LAB — COMPLETE METABOLIC PANEL WITH GFR
ALT: 13 U/L (ref 0–53)
AST: 13 U/L (ref 0–37)
Albumin: 4.5 g/dL (ref 3.5–5.2)
Alkaline Phosphatase: 91 U/L (ref 39–117)
BUN: 12 mg/dL (ref 6–23)
CHLORIDE: 104 meq/L (ref 96–112)
CO2: 28 meq/L (ref 19–32)
Calcium: 9.5 mg/dL (ref 8.4–10.5)
Creat: 0.84 mg/dL (ref 0.50–1.35)
GFR, Est Non African American: >89 mL/min
Glucose, Bld: 82 mg/dL (ref 70–99)
Potassium: 4.2 mEq/L (ref 3.5–5.3)
Sodium: 143 mEq/L (ref 135–145)
TOTAL PROTEIN: 7.3 g/dL (ref 6.0–8.3)
Total Bilirubin: 0.7 mg/dL (ref 0.2–1.2)

## 2014-09-11 LAB — T-HELPER CELL (CD4) - (RCID CLINIC ONLY)
CD4 % Helper T Cell: 20 % — ABNORMAL LOW (ref 33–55)
CD4 T Cell Abs: 840 /uL (ref 400–2700)

## 2014-09-11 LAB — HIV-1 RNA ULTRAQUANT REFLEX TO GENTYP+
HIV 1 RNA Quant: 203 {copies}/mL — ABNORMAL HIGH
HIV-1 RNA Quant, Log: 2.31 {Log} — ABNORMAL HIGH

## 2014-09-24 ENCOUNTER — Ambulatory Visit (INDEPENDENT_AMBULATORY_CARE_PROVIDER_SITE_OTHER): Payer: 59 | Admitting: Infectious Disease

## 2014-09-24 ENCOUNTER — Encounter: Payer: Self-pay | Admitting: Infectious Disease

## 2014-09-24 VITALS — BP 129/87 | HR 76 | Temp 98.7°F | Ht 67.0 in | Wt 146.0 lb

## 2014-09-24 DIAGNOSIS — B20 Human immunodeficiency virus [HIV] disease: Secondary | ICD-10-CM | POA: Diagnosis not present

## 2014-09-24 DIAGNOSIS — J4520 Mild intermittent asthma, uncomplicated: Secondary | ICD-10-CM

## 2014-09-24 NOTE — Progress Notes (Signed)
   Subjective:    Patient ID: David Mendez, male    DOB: 11/11/1995, 19 y.o.   MRN: [id number]  HPI   David Mendez is a 19 year old African-American male who has been recently diagnosed with HIV disease. It appears he was seen in November for a flulike illness but not tested for HIV at that time he was tested ultimately in April and found to be HIV by fourth generation assay with discriminatory test being positive. Here in our clinic his viral load was found to be above 4 million copies per milliliter of blood. CD4 count was still healthy above 500.  His HIV integrase genotype was negative for mutations as was his conventional genotype. He is HLA B5701 was negative and he does not have hepatitis B.  We discussed enrollment into the two different [facility] studies but he wished to start meds immediately and we put him on [place] which he has started. He had some initial diarrhea but this subsided. His viral load has dropped into the 200s.  Lab Results  Component Value Date   HIV1RNAQUANT 203* 09/10/2014   Lab Results  Component Value Date   CD4TABS 840 09/10/2014   CD4TABS 510 07/23/2014      Review of Systems  Constitutional: Negative for fever, chills, diaphoresis, activity change, appetite change, fatigue and unexpected weight change.  HENT: Negative for congestion, rhinorrhea, sinus pressure, sneezing, sore throat and trouble swallowing.   Eyes: Negative for photophobia and visual disturbance.  Respiratory: Negative for cough, chest tightness, shortness of breath, wheezing and stridor.   Cardiovascular: Negative for chest pain, palpitations and leg swelling.  Gastrointestinal: Negative for nausea, vomiting, abdominal pain, diarrhea, constipation, blood in stool, abdominal distention and anal bleeding.  Genitourinary: Negative for dysuria, hematuria, flank pain and difficulty urinating.  Musculoskeletal: Negative for myalgias, back pain, joint swelling, arthralgias and gait problem.    Skin: Negative for color change, pallor, rash and wound.  Neurological: Negative for dizziness, tremors, weakness and light-headedness.  Hematological: Negative for adenopathy. Does not bruise/bleed easily.  Psychiatric/Behavioral: Negative for behavioral problems, confusion, sleep disturbance, dysphoric mood, decreased concentration and agitation.       Objective:   Physical Exam  Constitutional: He is oriented to person, place, and time. He appears well-developed and well-nourished.  HENT:  Head: Normocephalic and atraumatic.  Eyes: Conjunctivae and EOM are normal.  Neck: Normal range of motion. Neck supple.  Cardiovascular: Normal rate and regular rhythm.   Pulmonary/Chest: Effort normal. No respiratory distress. He has no wheezes.  Abdominal: Soft. He exhibits no distension.  Musculoskeletal: Normal range of motion. He exhibits no edema or tenderness.  Neurological: He is alert and oriented to person, place, and time.  Skin: Skin is warm and dry. No rash noted. No erythema. No pallor.  Psychiatric: He has a normal mood and affect. His behavior is normal. Judgment and thought content normal.          Assessment & Plan:   HIV disease: continue Zeba and bring back in 2 months time for repeat labs. I spent greater than 25 minutes with the patient including greater than 50% of time in face to face counsel of the patient and in coordination of their care.   Asthma: Mild intermittent and not requiring corticosteroids or daily bronchodilators. Typically sets in in summer

## 2014-11-09 ENCOUNTER — Encounter: Payer: Self-pay | Admitting: Family Medicine

## 2014-11-09 DIAGNOSIS — Z8619 Personal history of other infectious and parasitic diseases: Secondary | ICD-10-CM | POA: Insufficient documentation

## 2014-11-09 DIAGNOSIS — K5909 Other constipation: Secondary | ICD-10-CM | POA: Insufficient documentation

## 2014-11-09 DIAGNOSIS — L239 Allergic contact dermatitis, unspecified cause: Secondary | ICD-10-CM | POA: Insufficient documentation

## 2014-11-09 DIAGNOSIS — Z21 Asymptomatic human immunodeficiency virus [HIV] infection status: Secondary | ICD-10-CM | POA: Insufficient documentation

## 2014-11-10 ENCOUNTER — Other Ambulatory Visit: Payer: Self-pay

## 2014-11-10 MED ORDER — CETIRIZINE HCL 10 MG PO TABS: 10.0000 mg | ORAL_TABLET | Freq: Every day | ORAL | Status: DC

## 2014-11-10 NOTE — Telephone Encounter (Signed)
Patient requesting refill. 

## 2014-11-11 ENCOUNTER — Other Ambulatory Visit: Payer: Self-pay

## 2014-11-12 ENCOUNTER — Other Ambulatory Visit: Payer: Self-pay

## 2014-11-12 ENCOUNTER — Other Ambulatory Visit: Payer: 59

## 2014-11-12 DIAGNOSIS — B20 Human immunodeficiency virus [HIV] disease: Secondary | ICD-10-CM

## 2014-11-12 LAB — CBC WITH DIFFERENTIAL/PLATELET
Basophils Absolute: 0.1 10*3/uL (ref 0.0–0.1)
Basophils Relative: 1 % (ref 0–1)
Eosinophils Absolute: 0.6 10*3/uL (ref 0.0–0.7)
Eosinophils Relative: 9 % — ABNORMAL HIGH (ref 0–5)
HCT: 41.9 % (ref 39.0–52.0)
HEMOGLOBIN: 14.5 g/dL (ref 13.0–17.0)
LYMPHS ABS: 3.2 10*3/uL (ref 0.7–4.0)
Lymphocytes Relative: 49 % — ABNORMAL HIGH (ref 12–46)
MCH: 30.1 pg (ref 26.0–34.0)
MCHC: 34.6 g/dL (ref 30.0–36.0)
MCV: 87.1 fL (ref 78.0–100.0)
MONO ABS: 0.5 10*3/uL (ref 0.1–1.0)
MONOS PCT: 7 % (ref 3–12)
MPV: 9.5 fL (ref 8.6–12.4)
NEUTROS ABS: 2.2 10*3/uL (ref 1.7–7.7)
NEUTROS PCT: 34 % — AB (ref 43–77)
Platelets: 257 10*3/uL (ref 150–400)
RBC: 4.81 MIL/uL (ref 4.22–5.81)
RDW: 14.6 % (ref 11.5–15.5)
WBC: 6.6 10*3/uL (ref 4.0–10.5)

## 2014-11-12 LAB — RPR

## 2014-11-12 LAB — COMPLETE METABOLIC PANEL WITH GFR
ALBUMIN: 4.7 g/dL (ref 3.6–5.1)
ALK PHOS: 76 U/L (ref 40–115)
ALT: 30 U/L (ref 9–46)
AST: 19 U/L (ref 10–40)
BILIRUBIN TOTAL: 0.6 mg/dL (ref 0.2–1.2)
BUN: 17 mg/dL (ref 7–25)
CALCIUM: 9.8 mg/dL (ref 8.6–10.3)
CO2: 30 mmol/L (ref 20–31)
Chloride: 104 mmol/L (ref 98–110)
Creat: 0.83 mg/dL (ref 0.60–1.35)
GFR, Est African American: >89 mL/min (ref >=60)
Glucose, Bld: 78 mg/dL (ref 65–99)
Potassium: 4.5 mmol/L (ref 3.5–5.3)
Sodium: 144 mmol/L (ref 135–146)
TOTAL PROTEIN: 7.5 g/dL (ref 6.1–8.1)

## 2014-11-13 ENCOUNTER — Ambulatory Visit: Payer: Self-pay | Admitting: Family Medicine

## 2014-11-13 LAB — MICROALBUMIN / CREATININE URINE RATIO
CREATININE, URINE: 220.1 mg/dL
MICROALB UR: 0.7 mg/dL (ref <2.0)
MICROALB/CREAT RATIO: 3.2 mg/g (ref 0.0–30.0)

## 2014-11-13 LAB — URINE CYTOLOGY ANCILLARY ONLY
CHLAMYDIA, DNA PROBE: NEGATIVE
NEISSERIA GONORRHEA: NEGATIVE

## 2014-11-13 LAB — T-HELPER CELL (CD4) - (RCID CLINIC ONLY)
CD4 % Helper T Cell: 27 % — ABNORMAL LOW (ref 33–55)
CD4 T CELL ABS: 910 /uL (ref 400–2700)

## 2014-11-13 LAB — HIV-1 RNA QUANT-NO REFLEX-BLD
HIV 1 RNA QUANT: 92 {copies}/mL — AB (ref <20)
HIV-1 RNA Quant, Log: 1.96 {Log} — ABNORMAL HIGH (ref <1.30)

## 2014-11-18 ENCOUNTER — Ambulatory Visit (INDEPENDENT_AMBULATORY_CARE_PROVIDER_SITE_OTHER): Payer: 59 | Admitting: Family Medicine

## 2014-11-18 ENCOUNTER — Encounter: Payer: Self-pay | Admitting: Family Medicine

## 2014-11-18 VITALS — BP 128/84 | HR 112 | Temp 98.8°F | Resp 18 | Ht 67.0 in | Wt 152.4 lb

## 2014-11-18 DIAGNOSIS — B20 Human immunodeficiency virus [HIV] disease: Secondary | ICD-10-CM | POA: Diagnosis not present

## 2014-11-18 DIAGNOSIS — Z23 Encounter for immunization: Secondary | ICD-10-CM | POA: Diagnosis not present

## 2014-11-18 DIAGNOSIS — J4520 Mild intermittent asthma, uncomplicated: Secondary | ICD-10-CM | POA: Diagnosis not present

## 2014-11-18 MED ORDER — IPRATROPIUM-ALBUTEROL 0.5-2.5 (3) MG/3ML IN SOLN: 3.0000 mL | Freq: Four times a day (QID) | RESPIRATORY_TRACT | Status: DC

## 2014-11-18 MED ORDER — MONTELUKAST SODIUM 10 MG PO TABS: 10.0000 mg | ORAL_TABLET | Freq: Every day | ORAL | Status: DC

## 2014-11-18 NOTE — Progress Notes (Addendum)
Name: David Mendez   MRN: [id number]    DOB: [birth year]   Date:11/18/2014       Progress Note  Subjective  Chief Complaint  Chief Complaint  Patient presents with  . Medication Refill    6 month F/U  . Asthma    Had a recent flair up a couple of days ago due to heat, wheezing.  . Allergic Rhinitis     Well Controlled with medications  . Pruritis    Onset 1 month, Back of Head and occurs every couple of days and states it goes away once patient washs his head with Head and Shoulders.    HPI   11/18/14 2355  Asthma History - Simple  Symptoms 0-2 days/week  Nighttime Awakenings 0-2/month  Asthma Severity Intermittent   Asthma Mild Intermittent: he is doing well, states uses rescue inhaler prn , at most once every few months, taking Singulair daily and denies side effects  AR: no symptoms at this time, no nasal congestion or rhinorrhea  HIV disease: seeing infectious disease every 6 months now, doing well, compliant with medication and denies side effects. Appetite has improved and he has gained over 6 lbs since last visit.   Seborrhea: likely the cause of itchy scalp that resolves with selenium sulfide. No lesions on scalp , no lice   Patient Active Problem List   Diagnosis Date Noted  . Allergic contact dermatitis 11/09/2014  . Chronic constipation 11/09/2014  . History of gonorrhea 11/09/2014  . HIV positive 11/09/2014  . History of giardia infection 11/09/2014  . HIV disease 08/06/2014  . Asthma, mild intermittent 08/06/2014  . Seasonal allergic rhinitis 08/31/2006    History reviewed. No pertinent past surgical history.  Family History  Problem Relation Age of Onset  . Asthma Mother   . Allergic rhinitis Mother   . Asthma Brother     Younger Brother    Social History   Social History  . Marital Status: Single    Spouse Name: N/A  . Number of Children: N/A  . Years of Education: N/A   Occupational History  . Not on file.   Social History Main  Topics  . Smoking status: Never Smoker   . Smokeless tobacco: Never Used  . Alcohol Use: No  . Drug Use: No  . Sexual Activity: Yes   Other Topics Concern  . Not on file   Social History Narrative     Current outpatient prescriptions:  .  Abacavir-Dolutegravir-Lamivud (TRIUMEQ) 600-50-300 MG TABS, Take 1 tablet by mouth daily., Disp: 30 tablet, Rfl: 11 .  cetirizine (ZYRTEC) 10 MG tablet, Take 1 tablet (10 mg total) by mouth daily., Disp: 90 tablet, Rfl: 1 .  montelukast (SINGULAIR) 10 MG tablet, Take 1 tablet (10 mg total) by mouth at bedtime., Disp: 90 tablet, Rfl: 1 .  triamcinolone cream (KENALOG) 0.1 %, Apply topically 2 (two) times daily as needed., Disp: , Rfl: 2 .  VENTOLIN HFA 108 (90 BASE) MCG/ACT inhaler, INHALE 2 PUFFS AS NEEDED FOR ASTHMA/WHEEZING, Disp: 18 Inhaler, Rfl: 0  Current facility-administered medications:  .  ipratropium-albuterol (DUONEB) 0.5-2.5 (3) MG/3ML nebulizer solution 3 mL, 3 mL, Nebulization, Q6H, Steele Sizer, MD  No Known Allergies   ROS  Constitutional: Negative for fever or weight change.  Respiratory: Negative for cough and shortness of breath.   Cardiovascular: Negative for chest pain or palpitations.  Gastrointestinal: Negative for abdominal pain, no bowel changes.  Musculoskeletal: Negative for gait problem or joint swelling.  Skin: Negative for rash.  Neurological: Negative for dizziness or headache.  No other specific complaints in a complete review of systems (except as listed in HPI above). Objective  Filed Vitals:   11/18/14 0930  BP: 128/84  Pulse: 112  Temp: 98.8 F (37.1 C)  TempSrc: Oral  Resp: 18  Height: _0  (1.702 m)  Weight: 152 lb 6.4 oz (69.128 kg)  SpO2: 98%    Body mass index is 23.86 kg/(m^2).  Physical Exam  Constitutional: Patient appears well-developed and well-nourished.No distress.  HEENT: head atraumatic, normocephalic, pupils equal and reactive to light, , neck supple, throat within normal  limits Cardiovascular: Normal rate, regular rhythm and normal heart sounds.  No murmur heard. No BLE edema. Pulmonary/Chest: Effort normal and breath sounds normal. No respiratory distress. Abdominal: Soft.  There is no tenderness. Psychiatric: Patient has a normal mood and affect. behavior is normal. Judgment and thought content normal. Skin: no rashes, no scalp lesion  Recent Results (from the past 2160 hour(s))  T-helper cell (CD4)- ([facility] only)     Status: Abnormal   Collection Time: 09/10/14  9:00 AM  Result Value Ref Range   CD4 T Cell Abs 840 400 - 2700 /uL   CD4 % Helper T Cell 20 (L) 33 - 55 %    Comment: Performed at [facility]  CBC with Differential/Platelet     Status: Abnormal   Collection Time: 09/10/14  9:35 AM  Result Value Ref Range   WBC 9.7 4.0 - 10.5 K/uL   RBC 4.79 4.22 - 5.81 MIL/uL   Hemoglobin 13.7 13.0 - 17.0 g/dL   HCT 41.2 39.0 - 52.0 %   MCV 86.0 78.0 - 100.0 fL   MCH 28.6 26.0 - 34.0 pg   MCHC 33.3 30.0 - 36.0 g/dL   RDW 15.7 (H) 11.5 - 15.5 %   Platelets 316 150 - 400 K/uL   MPV 9.5 8.6 - 12.4 fL   Neutrophils Relative % 25 (L) 43 - 77 %   Neutro Abs 2.4 1.7 - 7.7 K/uL   Lymphocytes Relative 45 12 - 46 %   Lymphs Abs 4.4 (H) 0.7 - 4.0 K/uL   Monocytes Relative 6 3 - 12 %   Monocytes Absolute 0.6 0.1 - 1.0 K/uL   Eosinophils Relative 23 (H) 0 - 5 %   Eosinophils Absolute 2.2 (H) 0.0 - 0.7 K/uL   Basophils Relative 1 0 - 1 %   Basophils Absolute 0.1 0.0 - 0.1 K/uL   Smear Review Criteria for review not met   COMPLETE METABOLIC PANEL WITH GFR     Status: None   Collection Time: 09/10/14  9:35 AM  Result Value Ref Range   Sodium 143 135 - 145 mEq/L   Potassium 4.2 3.5 - 5.3 mEq/L   Chloride 104 96 - 112 mEq/L   CO2 28 19 - 32 mEq/L   Glucose, Bld 82 70 - 99 mg/dL   BUN 12 6 - 23 mg/dL   Creat 0.84 0.50 - 1.35 mg/dL   Total Bilirubin 0.7 0.2 - 1.2 mg/dL   Alkaline Phosphatase 91 39 - 117 U/L   AST 13 0 - 37 U/L    ALT 13 0 - 53 U/L   Total Protein 7.3 6.0 - 8.3 g/dL   Albumin 4.5 3.5 - 5.2 g/dL   Calcium 9.5 8.4 - 10.5 mg/dL   GFR, Est African American >89 mL/min   GFR, Est Non African American >89  mL/min    Comment:   The estimated GFR is a calculation valid for adults (>=5 years old) that uses the CKD-EPI algorithm to adjust for age and sex. It is   not to be used for children, pregnant women, hospitalized patients,    patients on dialysis, or with rapidly changing kidney function. According to the NKDEP, eGFR >89 is normal, 60-89 shows mild impairment, 30-59 shows moderate impairment, 15-29 shows severe impairment and <15 is ESRD.     HIV-1 RNA ultraquant reflex to gentyp+     Status: Abnormal   Collection Time: 09/10/14  9:35 AM  Result Value Ref Range   HIV 1 RNA Quant 203 (H) <20 copies/mL    Comment: HIV Genotype cannot be performed due to low viral load.   HIV1 RNA Quant, Log 2.31 (H) <1.30 log 10    Comment:   HIV Genotype will reflex if the HIV Quant is 2000 copies/mL or higher.   This test utilizes the [place] FDA approved Roche HIV-1 Test Kit by RT-PCR.     Urine cytology ancillary only     Status: None   Collection Time: 11/12/14 12:00 AM  Result Value Ref Range   Chlamydia Negative     Comment: Normal Reference Range - Negative   Neisseria gonorrhea Negative     Comment: Normal Reference Range - Negative  T-helper cell (CD4)- ([facility] only)     Status: Abnormal   Collection Time: 11/12/14  9:00 AM  Result Value Ref Range   CD4 T Cell Abs 910 400 - 2700 /uL   CD4 % Helper T Cell 27 (L) 33 - 55 %    Comment: Performed at [facility]  HIV 1 RNA quant-no reflex-bld     Status: Abnormal   Collection Time: 11/12/14  9:24 AM  Result Value Ref Range   HIV 1 RNA Quant 92 (H) <20 copies/mL   HIV1 RNA Quant, Log 1.96 (H) <1.30 log 10    Comment:   This test utilizes the [place] FDA approved Roche HIV-1 Test Kit by RT-PCR.     CBC with Differential/Platelet      Status: Abnormal   Collection Time: 11/12/14  9:25 AM  Result Value Ref Range   WBC 6.6 4.0 - 10.5 K/uL   RBC 4.81 4.22 - 5.81 MIL/uL   Hemoglobin 14.5 13.0 - 17.0 g/dL   HCT 41.9 39.0 - 52.0 %   MCV 87.1 78.0 - 100.0 fL   MCH 30.1 26.0 - 34.0 pg   MCHC 34.6 30.0 - 36.0 g/dL   RDW 14.6 11.5 - 15.5 %   Platelets 257 150 - 400 K/uL   MPV 9.5 8.6 - 12.4 fL   Neutrophils Relative % 34 (L) 43 - 77 %   Neutro Abs 2.2 1.7 - 7.7 K/uL   Lymphocytes Relative 49 (H) 12 - 46 %   Lymphs Abs 3.2 0.7 - 4.0 K/uL   Monocytes Relative 7 3 - 12 %   Monocytes Absolute 0.5 0.1 - 1.0 K/uL   Eosinophils Relative 9 (H) 0 - 5 %   Eosinophils Absolute 0.6 0.0 - 0.7 K/uL   Basophils Relative 1 0 - 1 %   Basophils Absolute 0.1 0.0 - 0.1 K/uL   Smear Review Criteria for review not met   COMPLETE METABOLIC PANEL WITH GFR     Status: None   Collection Time: 11/12/14  9:25 AM  Result Value Ref Range   Sodium 144 135 -  146 mmol/L   Potassium 4.5 3.5 - 5.3 mmol/L   Chloride 104 98 - 110 mmol/L   CO2 30 20 - 31 mmol/L   Glucose, Bld 78 65 - 99 mg/dL   BUN 17 7 - 25 mg/dL   Creat 0.83 0.60 - 1.35 mg/dL   Total Bilirubin 0.6 0.2 - 1.2 mg/dL   Alkaline Phosphatase 76 40 - 115 U/L   AST 19 10 - 40 U/L   ALT 30 9 - 46 U/L   Total Protein 7.5 6.1 - 8.1 g/dL   Albumin 4.7 3.6 - 5.1 g/dL   Calcium 9.8 8.6 - 10.3 mg/dL   GFR, Est African American >89 >=60 mL/min   GFR, Est Non African American >89 >=60 mL/min    Comment:   The estimated GFR is a calculation valid for adults (>=40 years old) that uses the CKD-EPI algorithm to adjust for age and sex. It is   not to be used for children, pregnant women, hospitalized patients,    patients on dialysis, or with rapidly changing kidney function. According to the NKDEP, eGFR >89 is normal, 60-89 shows mild impairment, 30-59 shows moderate impairment, 15-29 shows severe impairment and <15 is ESRD.     RPR     Status: None   Collection Time: 11/12/14  9:25 AM   Result Value Ref Range   RPR Ser Ql NON REAC NON REAC    Comment:   Footnotes:  (1) ** Please note change in unit of measure and reference range(s). **     Microalbumin / creatinine urine ratio     Status: None   Collection Time: 11/12/14  9:26 AM  Result Value Ref Range   Microalb, Ur 0.7 <2.0 mg/dL    Comment: The ADA (Diabetes Care 6073;71(GGYIR 1):S14-S80) has defined abnormalities in albumin excretion as follows:            Category           Result                            (mg/g creatinine)                 Normal:    <30       Microalbuminuria:    30 - 299   Clinical albuminuria:    > or = 300    The ADA recommends that at least two of three specimens collected within a 3 - 6 month period be abnormal before considering a patient to be within a diagnostic category.    Creatinine, Urine 220.1 mg/dL    Comment: No reference range established.   Microalb Creat Ratio 3.2 0.0 - 30.0 mg/g     PHQ2/9: Depression screen [facility] 2/9 11/18/2014 08/06/2014  Decreased Interest 0 0  Down, Depressed, Hopeless 0 0  PHQ - 2 Score 0 0     Fall Risk: Fall Risk  11/18/2014 09/24/2014 08/06/2014  Falls in the past year? No No No     Assessment & Plan  1. Intrinsic asthma without status asthmaticus, mild intermittent, uncomplicated  - Spirometry: Pre & Post Eval - ipratropium-albuterol (DUONEB) 0.5-2.5 (3) MG/3ML nebulizer solution 3 mL; Take 3 mLs by nebulization every 6 (six) hours. - montelukast (SINGULAIR) 10 MG tablet; Take 1 tablet (10 mg total) by mouth at bedtime.  Dispense: 90 tablet; Refill: 1  2. Needs flu shot  - Flu Vaccine QUAD 36+ mos PF IM (  Fluarix & Fluzone Quad PF)  3. HIV disease  He has a friend that has HIV also told his brother. His mother still does not know. Reading more and educating self about the disease

## 2014-11-26 ENCOUNTER — Ambulatory Visit (INDEPENDENT_AMBULATORY_CARE_PROVIDER_SITE_OTHER): Payer: 59 | Admitting: Infectious Disease

## 2014-11-26 ENCOUNTER — Encounter: Payer: Self-pay | Admitting: Infectious Disease

## 2014-11-26 VITALS — BP 111/79 | HR 83 | Temp 98.3°F | Wt 152.0 lb

## 2014-11-26 DIAGNOSIS — J302 Other seasonal allergic rhinitis: Secondary | ICD-10-CM

## 2014-11-26 DIAGNOSIS — B20 Human immunodeficiency virus [HIV] disease: Secondary | ICD-10-CM | POA: Diagnosis not present

## 2014-11-26 DIAGNOSIS — Z8619 Personal history of other infectious and parasitic diseases: Secondary | ICD-10-CM

## 2014-11-26 MED ORDER — IPRATROPIUM-ALBUTEROL 0.5-2.5 (3) MG/3ML IN SOLN: 3.0000 mL | Freq: Four times a day (QID) | RESPIRATORY_TRACT | Status: DC | PRN

## 2014-11-26 NOTE — Progress Notes (Signed)
Chief complaint: 2 month followup for HIV on medications  Subjective:    Patient ID: David Mendez, male    DOB: 08/20/1995, 19 y.o.   MRN: [id number]  HPI   Mr. Pesta is a 19 year old African-American male with HIV disease who is currently on TRIUMEQ and reasonably virally suppressed with most recent VL of 92, and healthy CD4 count.  Lab Results  Component Value Date   HIV1RNAQUANT 92* 11/12/2014   Lab Results  Component Value Date   CD4TABS 910 11/12/2014   CD4TABS 840 09/10/2014   CD4TABS 510 07/23/2014    He has seen her PCP for chronic asthma management.   He is without any other specific complaints today.  He is living with Mom still but she does not know about his diagnosis. She does know of his sexual orientation.  Past Medical History  Diagnosis Date  . Asthma   . HIV disease 08/06/2014  . Asthma, chronic 08/06/2014  . Allergy   . History of giardia infection   . Industrial dermatitis   . Loss of weight   . HIV antibody positive    No past surgical history on file.   Family History  Problem Relation Age of Onset  . Asthma Mother   . Allergic rhinitis Mother   . Asthma Brother     Younger Brother    Social History  Substance Use Topics  . Smoking status: Never Smoker   . Smokeless tobacco: Never Used  . Alcohol Use: No    Current outpatient prescriptions:  .  Abacavir-Dolutegravir-Lamivud (TRIUMEQ) 600-50-300 MG TABS, Take 1 tablet by mouth daily., Disp: 30 tablet, Rfl: 11 .  cetirizine (ZYRTEC) 10 MG tablet, Take 1 tablet (10 mg total) by mouth daily., Disp: 90 tablet, Rfl: 1 .  montelukast (SINGULAIR) 10 MG tablet, Take 1 tablet (10 mg total) by mouth at bedtime., Disp: 90 tablet, Rfl: 1 .  VENTOLIN HFA 108 (90 BASE) MCG/ACT inhaler, INHALE 2 PUFFS AS NEEDED FOR ASTHMA/WHEEZING, Disp: 18 Inhaler, Rfl: 0 .  ipratropium-albuterol (DUONEB) 0.5-2.5 (3) MG/3ML SOLN, Take 3 mLs by nebulization every 6 (six) hours as needed., Disp: 360 mL, Rfl: 3 .   triamcinolone cream (KENALOG) 0.1 %, Apply topically 2 (two) times daily as needed., Disp: , Rfl: 2  No Known Allergies    Review of Systems  Constitutional: Negative for fever, chills, diaphoresis, activity change, appetite change, fatigue and unexpected weight change.  HENT: Negative for congestion, rhinorrhea, sinus pressure, sneezing, sore throat and trouble swallowing.   Eyes: Negative for photophobia and visual disturbance.  Respiratory: Negative for cough, chest tightness, shortness of breath, wheezing and stridor.   Cardiovascular: Negative for chest pain, palpitations and leg swelling.  Gastrointestinal: Negative for nausea, vomiting, abdominal pain, diarrhea, constipation, blood in stool, abdominal distention and anal bleeding.  Genitourinary: Negative for dysuria, hematuria, flank pain and difficulty urinating.  Musculoskeletal: Negative for myalgias, back pain, joint swelling, arthralgias and gait problem.  Skin: Negative for color change, pallor, rash and wound.  Neurological: Negative for dizziness, tremors, weakness and light-headedness.  Hematological: Negative for adenopathy. Does not bruise/bleed easily.  Psychiatric/Behavioral: Negative for behavioral problems, confusion, sleep disturbance, dysphoric mood, decreased concentration and agitation.       Objective:   Physical Exam  Constitutional: He is oriented to person, place, and time. He appears well-developed and well-nourished.  HENT:  Head: Normocephalic and atraumatic.  Eyes: Conjunctivae and EOM are normal.  Neck: Normal range of motion. Neck supple.  Cardiovascular:  Normal rate and regular rhythm.   Pulmonary/Chest: Effort normal. No respiratory distress. He has no wheezes.  Abdominal: Soft. He exhibits no distension.  Musculoskeletal: Normal range of motion. He exhibits no edema or tenderness.  Neurological: He is alert and oriented to person, place, and time.  Skin: Skin is warm and dry. No rash noted.  No erythema. No pallor.  Psychiatric: He has a normal mood and affect. His behavior is normal. Judgment and thought content normal.          Assessment & Plan:   HIV disease: continue TRIUMEQ and bring back in 6 months time for repeat labs.   Asthma: Mild intermittent and not requiring corticosteroids or daily bronchodilators.   Gonorrhea: will recheck urine today. NO other symptoms.  HCM; flu shot given at primary care    I spent greater than 25 minutes with the patient including greater than 50% of time in face to face counsel of the patient re his HIV, asthma, GC and in coordination of their care.    Acey Lav, MD

## 2015-01-07 ENCOUNTER — Other Ambulatory Visit: Payer: Self-pay | Admitting: Family Medicine

## 2015-02-05 ENCOUNTER — Other Ambulatory Visit: Payer: Self-pay | Admitting: Family Medicine

## 2015-02-05 ENCOUNTER — Encounter: Payer: Self-pay | Admitting: Family Medicine

## 2015-02-05 ENCOUNTER — Ambulatory Visit (INDEPENDENT_AMBULATORY_CARE_PROVIDER_SITE_OTHER): Payer: 59 | Admitting: Family Medicine

## 2015-02-05 VITALS — BP 122/68 | HR 82 | Temp 98.0°F | Resp 16 | Ht 66.0 in | Wt 156.8 lb

## 2015-02-05 DIAGNOSIS — K5900 Constipation, unspecified: Secondary | ICD-10-CM | POA: Diagnosis not present

## 2015-02-05 DIAGNOSIS — R3911 Hesitancy of micturition: Secondary | ICD-10-CM | POA: Diagnosis not present

## 2015-02-05 DIAGNOSIS — B20 Human immunodeficiency virus [HIV] disease: Secondary | ICD-10-CM | POA: Diagnosis not present

## 2015-02-05 DIAGNOSIS — K5909 Other constipation: Secondary | ICD-10-CM

## 2015-02-05 LAB — POCT URINALYSIS DIPSTICK
BILIRUBIN UA: NEGATIVE
Glucose, UA: NEGATIVE
KETONES UA: NEGATIVE
Leukocytes, UA: NEGATIVE
Nitrite, UA: NEGATIVE
PH UA: 7
RBC UA: NEGATIVE
SPEC GRAV UA: 1.015
Urobilinogen, UA: 1

## 2015-02-05 MED ORDER — LINACLOTIDE 145 MCG PO CAPS: 145.0000 ug | ORAL_CAPSULE | Freq: Every day | ORAL | Status: DC

## 2015-02-05 MED ORDER — CIPROFLOXACIN HCL 250 MG PO TABS: 250.0000 mg | ORAL_TABLET | Freq: Two times a day (BID) | ORAL | Status: DC

## 2015-02-05 NOTE — Progress Notes (Signed)
Name: David Mendez   MRN: [id number]    DOB: 05-Jan-1996   Date:02/05/2015       Progress Note  Subjective  Chief Complaint  Chief Complaint  Patient presents with  . Constipation    Onset-1 week, improving taking Magnesium powder and has helped a little. Has had a little bowel movement since taking the medicine. Patient states he feels a little bump down there.  . Urinary Problems    Patient states his urine has a slow start up and feels like his urethra is tight and the urine is having a hard time getting out, also having lower back pain.    HPI   Anal lump: he states he had to strain some last week while having a bowel movement and felt an anal lump, it was tender initially but now problems, but would like to be checked  Urinary hesitancy: he has noticed hesitancy, dysuria, and low back pain, no rectal pain, no fever, no hematuria. Denies intercourse since last visit. Previous history of STI  HIV: he has gained weight, taking medication as prescribed, on a 6 month follow up schedule. No diarrhea, no nausea, no rashes.   Chronic constipation: he has not been taking any medication for it, he used to take Miralax, this week only had one bowel movement on Monday, small amount of stools, had to strain, no blood in stools.   Patient Active Problem List   Diagnosis Date Noted  . Allergic contact dermatitis 11/09/2014  . Chronic constipation 11/09/2014  . History of gonorrhea 11/09/2014  . HIV positive ([place]) 11/09/2014  . History of giardia infection 11/09/2014  . HIV disease ([place]) 08/06/2014  . Asthma, mild intermittent 08/06/2014  . Seasonal allergic rhinitis 08/31/2006    No past surgical history on file.  Family History  Problem Relation Age of Onset  . Asthma Mother   . Allergic rhinitis Mother   . Asthma Brother     Younger Brother    Social History   Social History  . Marital Status: Single    Spouse Name: N/A  . Number of Children: N/A  . Years of Education:  N/A   Occupational History  . Not on file.   Social History Main Topics  . Smoking status: Never Smoker   . Smokeless tobacco: Never Used  . Alcohol Use: No  . Drug Use: No  . Sexual Activity: Yes   Other Topics Concern  . Not on file   Social History Narrative     Current outpatient prescriptions:  .  Abacavir-Dolutegravir-Lamivud (TRIUMEQ) 600-50-300 MG TABS, Take 1 tablet by mouth daily., Disp: 30 tablet, Rfl: 11 .  cetirizine (ZYRTEC) 10 MG tablet, Take 1 tablet (10 mg total) by mouth daily., Disp: 90 tablet, Rfl: 1 .  ipratropium-albuterol (DUONEB) 0.5-2.5 (3) MG/3ML SOLN, Take 3 mLs by nebulization every 6 (six) hours as needed., Disp: 360 mL, Rfl: 3 .  montelukast (SINGULAIR) 10 MG tablet, TAKE 1 TABLET EVERY DAY, Disp: 30 tablet, Rfl: 5 .  triamcinolone cream (KENALOG) 0.1 %, Apply topically 2 (two) times daily as needed., Disp: , Rfl: 2 .  VENTOLIN HFA 108 (90 BASE) MCG/ACT inhaler, INHALE 2 PUFFS AS NEEDED FOR ASTHMA/WHEEZING, Disp: 18 Inhaler, Rfl: 0  No Known Allergies   ROS  Ten systems reviewed and is negative except as mentioned in HPI   Objective  Filed Vitals:   02/05/15 1216  BP: 122/68  Pulse: 82  Temp: 98 F (36.7 C)  TempSrc: Oral  Resp: 16  Height: 5' 6" (1.676 m)  Weight: 156 lb 12.8 oz (71.124 kg)  SpO2: 97%    Body mass index is 25.32 kg/(m^2).  Physical Exam  Constitutional: Patient appears well-developed and well-nourished. No distress.  HEENT: head atraumatic, normocephalic, pupils equal and reactive to light, neck supple, throat within normal limits Cardiovascular: Normal rate, regular rhythm and normal heart sounds.  No murmur heard. No BLE edema. Pulmonary/Chest: Effort normal and breath sounds normal. No respiratory distress. Abdominal: Soft.  There is no tenderness. GU: normal exam Rectal exam: normal, no nodules, no external hemorrhoids or skin tags Psychiatric: Patient has a normal mood and affect. behavior is normal.  Judgment and thought content normal.  Recent Results (from the past 2160 hour(s))  Urine cytology ancillary only     Status: None   Collection Time: 11/12/14 12:00 AM  Result Value Ref Range   Chlamydia Negative     Comment: Normal Reference Range - Negative   Neisseria gonorrhea Negative     Comment: Normal Reference Range - Negative  T-helper cell (CD4)- ([facility] clinic only)     Status: Abnormal   Collection Time: 11/12/14  9:00 AM  Result Value Ref Range   CD4 T Cell Abs 910 400 - 2700 /uL   CD4 % Helper T Cell 27 (L) 33 - 55 %    Comment: Performed at [facility]  HIV 1 RNA quant-no reflex-bld     Status: Abnormal   Collection Time: 11/12/14  9:24 AM  Result Value Ref Range   HIV 1 RNA Quant 92 (H) <20 copies/mL   HIV1 RNA Quant, Log 1.96 (H) <1.30 log 10    Comment:   This test utilizes the [place] FDA approved Roche HIV-1 Test Kit by RT-PCR.     CBC with Differential/Platelet     Status: Abnormal   Collection Time: 11/12/14  9:25 AM  Result Value Ref Range   WBC 6.6 4.0 - 10.5 K/uL   RBC 4.81 4.22 - 5.81 MIL/uL   Hemoglobin 14.5 13.0 - 17.0 g/dL   HCT 41.9 39.0 - 52.0 %   MCV 87.1 78.0 - 100.0 fL   MCH 30.1 26.0 - 34.0 pg   MCHC 34.6 30.0 - 36.0 g/dL   RDW 14.6 11.5 - 15.5 %   Platelets 257 150 - 400 K/uL   MPV 9.5 8.6 - 12.4 fL   Neutrophils Relative % 34 (L) 43 - 77 %   Neutro Abs 2.2 1.7 - 7.7 K/uL   Lymphocytes Relative 49 (H) 12 - 46 %   Lymphs Abs 3.2 0.7 - 4.0 K/uL   Monocytes Relative 7 3 - 12 %   Monocytes Absolute 0.5 0.1 - 1.0 K/uL   Eosinophils Relative 9 (H) 0 - 5 %   Eosinophils Absolute 0.6 0.0 - 0.7 K/uL   Basophils Relative 1 0 - 1 %   Basophils Absolute 0.1 0.0 - 0.1 K/uL   Smear Review Criteria for review not met   COMPLETE METABOLIC PANEL WITH GFR     Status: None   Collection Time: 11/12/14  9:25 AM  Result Value Ref Range   Sodium 144 135 - 146 mmol/L   Potassium 4.5 3.5 - 5.3 mmol/L   Chloride 104 98 - 110 mmol/L   CO2 30  20 - 31 mmol/L   Glucose, Bld 78 65 - 99 mg/dL   BUN 17 7 - 25 mg/dL   Creat 0.83 0.60 - 1.35 mg/dL  Total Bilirubin 0.6 0.2 - 1.2 mg/dL   Alkaline Phosphatase 76 40 - 115 U/L   AST 19 10 - 40 U/L   ALT 30 9 - 46 U/L   Total Protein 7.5 6.1 - 8.1 g/dL   Albumin 4.7 3.6 - 5.1 g/dL   Calcium 9.8 8.6 - 10.3 mg/dL   GFR, Est African American >89 >=60 mL/min   GFR, Est Non African American >89 >=60 mL/min    Comment:   The estimated GFR is a calculation valid for adults (>=18 years old) that uses the CKD-EPI algorithm to adjust for age and sex. It is   not to be used for children, pregnant women, hospitalized patients,    patients on dialysis, or with rapidly changing kidney function. According to the NKDEP, eGFR >89 is normal, 60-89 shows mild impairment, 30-59 shows moderate impairment, 15-29 shows severe impairment and <15 is ESRD.     RPR     Status: None   Collection Time: 11/12/14  9:25 AM  Result Value Ref Range   RPR Ser Ql NON REAC NON REAC    Comment:   Footnotes:  (1) ** Please note change in unit of measure and reference range(s). **     Microalbumin / creatinine urine ratio     Status: None   Collection Time: 11/12/14  9:26 AM  Result Value Ref Range   Microalb, Ur 0.7 <2.0 mg/dL    Comment: The ADA (Diabetes Care 2014;37(suppl 1):S14-S80) has defined abnormalities in albumin excretion as follows:            Category           Result                            (mg/g creatinine)                 Normal:    <30       Microalbuminuria:    30 - 299   Clinical albuminuria:    > or = 300    The ADA recommends that at least two of three specimens collected within a 3 - 6 month period be abnormal before considering a patient to be within a diagnostic category.    Creatinine, Urine 220.1 mg/dL    Comment: No reference range established.   Microalb Creat Ratio 3.2 0.0 - 30.0 mg/g  POCT Urinalysis Dipstick     Status: Normal   Collection Time: 02/05/15 12:22 PM   Result Value Ref Range   Color, UA dark yellow    Clarity, UA clear    Glucose, UA neg    Bilirubin, UA neg    Ketones, UA neg    Spec Grav, UA 1.015    Blood, UA neg    pH, UA 7.0    Protein, UA trace    Urobilinogen, UA 1.0    Nitrite, UA neg    Leukocytes, UA Negative Negative     PHQ2/9: Depression screen PHQ 2/9 02/05/2015 11/26/2014 11/18/2014 08/06/2014  Decreased Interest 0 0 0 0  Down, Depressed, Hopeless 0 0 0 0  PHQ - 2 Score 0 0 0 0    Fall Risk: Fall Risk  02/05/2015 11/26/2014 11/18/2014 09/24/2014 08/06/2014  Falls in the past year? No No No No No    Functional Status Survey: Is the patient deaf or have difficulty hearing?: No Does the patient have difficulty seeing, even when wearing glasses/contacts?:   No Does the patient have difficulty concentrating, remembering, or making decisions?: No Does the patient have difficulty walking or climbing stairs?: No Does the patient have difficulty dressing or bathing?: No Does the patient have difficulty doing errands alone such as visiting a doctor's office or shopping?: No   Assessment & Plan  1. Urinary hesitancy  - POCT Urinalysis Dipstick - GC/chlamydia probe amp, urine - Urine culture; Future -Cipro 250 mg twice daily #6  2. Chronic constipation  We will give her Linzess, needs to have regular bowel movements to avoid anal lump to return - Linaclotide (LINZESS) 145 MCG CAPS capsule; Take 1 capsule (145 mcg total) by mouth daily.  Dispense: 30 capsule; Refill: 2  3. HIV disease ([place])  Continue follow up with ID

## 2015-02-09 LAB — PLEASE NOTE

## 2015-02-12 LAB — CT, NG, MYCOPLASMAS NAA, URINE
CHLAMYDIA TRACHOMATIS, NAA: NEGATIVE
Neisseria gonorrhoeae, NAA: NEGATIVE

## 2015-03-03 LAB — SPECIMEN STATUS REPORT

## 2015-03-04 ENCOUNTER — Telehealth: Payer: Self-pay | Admitting: Family Medicine

## 2015-03-04 NOTE — Telephone Encounter (Signed)
Patient is calling to get test results from last visit.

## 2015-03-05 ENCOUNTER — Telehealth: Payer: Self-pay

## 2015-03-05 NOTE — Telephone Encounter (Signed)
Patient called to inquire about his urine gonorrheae and chlamydia results. I called [facility] to have them fax since patient had them done on 02/05/2015 and they were not in our sytem. [facility] did fax results and I printed them to show Dr. Carlynn PurlSowles, since patient states he is still having urinary hesitancy. GC/Ch. Results came back Negative, patient was then called and informed and told if still having problems was advised to drop off a urine sample so we can dip and sent off for culture. Patient stated he would be by early tomorrow morning 03/06/15 after work to give urine sample.

## 2015-03-06 ENCOUNTER — Encounter: Payer: Self-pay | Admitting: Family Medicine

## 2015-03-06 ENCOUNTER — Ambulatory Visit (INDEPENDENT_AMBULATORY_CARE_PROVIDER_SITE_OTHER): Payer: 59 | Admitting: Family Medicine

## 2015-03-06 ENCOUNTER — Other Ambulatory Visit: Payer: Self-pay | Admitting: Family Medicine

## 2015-03-06 VITALS — BP 108/68 | HR 90 | Temp 99.3°F | Resp 18 | Ht 66.0 in | Wt 157.5 lb

## 2015-03-06 DIAGNOSIS — Z202 Contact with and (suspected) exposure to infections with a predominantly sexual mode of transmission: Secondary | ICD-10-CM

## 2015-03-06 DIAGNOSIS — Z8619 Personal history of other infectious and parasitic diseases: Secondary | ICD-10-CM

## 2015-03-06 DIAGNOSIS — R369 Urethral discharge, unspecified: Secondary | ICD-10-CM

## 2015-03-06 DIAGNOSIS — R300 Dysuria: Secondary | ICD-10-CM | POA: Diagnosis not present

## 2015-03-06 MED ORDER — AZITHROMYCIN 500 MG PO TABS
1000.0000 mg | ORAL_TABLET | Freq: Once | ORAL | Status: AC
  Administered 2015-03-06: 1000 mg via ORAL

## 2015-03-06 MED ORDER — CEFTRIAXONE SODIUM 250 MG IJ SOLR
250.0000 mg | Freq: Once | INTRAMUSCULAR | Status: AC
  Administered 2015-03-06: 250 mg via INTRAMUSCULAR

## 2015-03-06 MED ORDER — CEFTRIAXONE SODIUM 1 G IJ SOLR: 250.0000 mg | Freq: Once | INTRAMUSCULAR | Status: DC

## 2015-03-06 MED ORDER — LIDOCAINE HCL (PF) 1 % IJ SOLN
2.0000 mL | Freq: Once | INTRAMUSCULAR | Status: AC
  Administered 2015-03-06: 2 mL via INTRADERMAL

## 2015-03-06 NOTE — Progress Notes (Signed)
Name: David Mendez   MRN: [id number]    DOB: August 31, 1995   Date:03/06/2015       Progress Note  Subjective  Chief Complaint  Chief Complaint  Patient presents with  . Penile Discharge    White discharge, and has had a new partner (but wearing protection) since he was checked last month for GC/Ch but came back Negative. But still having the same problems.     HPI  Penile Discharge: he has HIV disease, sees ID in [place] , he is taking medication as prescribed, he does not skip doses. He has been sexually active - he states his partner knows that he is HIV positive -  they are not using condoms 100 % of the time -  However he is having recurrent STI's.  He would like to be treated empirically for this infection this time. Symptoms present for the past few weeks. Last genprobe negative  Patient Active Problem List   Diagnosis Date Noted  . Allergic contact dermatitis 11/09/2014  . Chronic constipation 11/09/2014  . History of gonorrhea 11/09/2014  . HIV positive ([facility]) 11/09/2014  . History of giardia infection 11/09/2014  . HIV disease ([facility]) 08/06/2014  . Asthma, mild intermittent 08/06/2014  . Seasonal allergic rhinitis 08/31/2006    No past surgical history on file.  Family History  Problem Relation Age of Onset  . Asthma Mother   . Allergic rhinitis Mother   . Asthma Brother     Younger Brother    Social History   Social History  . Marital Status: Single    Spouse Name: N/A  . Number of Children: N/A  . Years of Education: N/A   Occupational History  . Not on file.   Social History Main Topics  . Smoking status: Never Smoker   . Smokeless tobacco: Never Used  . Alcohol Use: No  . Drug Use: No  . Sexual Activity: Yes   Other Topics Concern  . Not on file   Social History Narrative     Current outpatient prescriptions:  .  Abacavir-Dolutegravir-Lamivud (TRIUMEQ) 600-50-300 MG TABS, Take 1 tablet by mouth daily., Disp: 30 tablet, Rfl: 11 .   cetirizine (ZYRTEC) 10 MG tablet, Take 1 tablet (10 mg total) by mouth daily., Disp: 90 tablet, Rfl: 1 .  ciprofloxacin (CIPRO) 250 MG tablet, Take 1 tablet (250 mg total) by mouth 2 (two) times daily., Disp: 6 tablet, Rfl: 0 .  ipratropium-albuterol (DUONEB) 0.5-2.5 (3) MG/3ML SOLN, Take 3 mLs by nebulization every 6 (six) hours as needed., Disp: 360 mL, Rfl: 3 .  Linaclotide (LINZESS) 145 MCG CAPS capsule, Take 1 capsule (145 mcg total) by mouth daily., Disp: 30 capsule, Rfl: 2 .  montelukast (SINGULAIR) 10 MG tablet, TAKE 1 TABLET EVERY DAY, Disp: 30 tablet, Rfl: 5 .  triamcinolone cream (KENALOG) 0.1 %, Apply topically 2 (two) times daily as needed., Disp: , Rfl: 2 .  VENTOLIN HFA 108 (90 BASE) MCG/ACT inhaler, INHALE 2 PUFFS AS NEEDED FOR ASTHMA/WHEEZING, Disp: 18 Inhaler, Rfl: 0  No Known Allergies   ROS  Ten systems reviewed and is negative except as mentioned in HPI   Objective  Filed Vitals:   03/06/15 0833  BP: 108/68  Pulse: 90  Temp: 99.3 F (37.4 C)  TempSrc: Oral  Resp: 18  Height:  (1.676 m)  Weight: 157 lb 8 oz (71.442 kg)  SpO2: 97%    Body mass index is 25.43 kg/(m^2).  Physical Exam  Constitutional: Patient  appears well-developed and well-nourished. No distress.  HEENT: head atraumatic, normocephalic, pupils equal and reactive to light, neck supple, throat within normal limits Cardiovascular: Normal rate, regular rhythm and normal heart sounds.  No murmur heard. No BLE edema. Pulmonary/Chest: Effort normal and breath sounds normal. No respiratory distress. Abdominal: Soft.  There is no tenderness. GU: penile discharge, clear in color Psychiatric: Patient has a normal mood and affect. behavior is normal. Judgment and thought content normal.  Recent Results (from the past 2160 hour(s))  Please Note     Status: None   Collection Time: 02/05/15 12:00 AM  Result Value Ref Range   Please note Comment     Comment: The date and/or time of collection was  not indicated on the requisition as required by state and federal law.  The date of receipt of the specimen was used as the collection date if not supplied.   Ct, Ng, Mycoplasmas NAA, Urine     Status: None   Collection Time: 02/05/15 12:00 AM  Result Value Ref Range   Chlamydia trachomatis, NAA Negative Negative   Neisseria gonorrhoeae, NAA Negative Negative  Specimen status report     Status: None   Collection Time: 02/05/15 12:00 AM  Result Value Ref Range   specimen status report Comment     Comment: Verbal Add-on Verbal Add-on This report has been generated by your request for additional testing. The additional request may have required some testing to be repeated. Because of analytic variability, the results may not correspond exactly to the previous report. Interpret these results appropriately. Written Authorization Written Authorization No Written Authorization Received.   POCT Urinalysis Dipstick     Status: Normal   Collection Time: 02/05/15 12:22 PM  Result Value Ref Range   Color, UA dark yellow    Clarity, UA clear    Glucose, UA neg    Bilirubin, UA neg    Ketones, UA neg    Spec Grav, UA 1.015    Blood, UA neg    pH, UA 7.0    Protein, UA trace    Urobilinogen, UA 1.0    Nitrite, UA neg    Leukocytes, UA Negative Negative     [facility]: [facility] 02/05/2015 11/26/2014 11/18/2014 08/06/2014  Decreased Interest 0 0 0 0  Down, Depressed, Hopeless 0 0 0 0  PHQ - 2 Score 0 0 0 0   Fall Risk: Fall Risk  02/05/2015 11/26/2014 11/18/2014 09/24/2014 08/06/2014  Falls in the past year? No No No No No      Assessment & Plan   1. Penile discharge  Needs to use condoms.  - lidocaine (PF) (XYLOCAINE) 1 % injection 2 mL; Inject 2 mLs into the skin once. - azithromycin (ZITHROMAX) tablet 1,000 mg; Take 2 tablets (1,000 mg total) by mouth once. - cefTRIAXone (ROCEPHIN) injection 250 mg; Inject 250 mg into the muscle once.  2. History of  gonorrhea  - lidocaine (PF) (XYLOCAINE) 1 % injection 2 mL; Inject 2 mLs into the skin once. - azithromycin (ZITHROMAX) tablet 1,000 mg; Take 2 tablets (1,000 mg total) by mouth once.  3. Exposure to STD  - GC/chlamydia probe amp, urine - cefTRIAXone (ROCEPHIN) injection 250 mg; Inject 250 mg into the muscle once.  4. Dysuria  - Urine culture - GC/chlamydia probe amp, urine - lidocaine (PF) (XYLOCAINE) 1 % injection 2 mL; Inject 2 mLs into the skin once. - azithromycin (ZITHROMAX) tablet 1,000 mg; Take 2 tablets (1,000 mg total)  by mouth once. - cefTRIAXone (ROCEPHIN) injection 250 mg; Inject 250 mg into the muscle once.

## 2015-03-07 LAB — URINE CULTURE: Organism ID, Bacteria: NO GROWTH

## 2015-03-10 LAB — PLEASE NOTE

## 2015-03-11 LAB — SPECIMEN STATUS REPORT

## 2015-03-11 LAB — GC/CHLAMYDIA PROBE AMP
CHLAMYDIA, DNA PROBE: POSITIVE — AB
Neisseria gonorrhoeae by PCR: NEGATIVE

## 2015-03-24 ENCOUNTER — Telehealth: Payer: Self-pay | Admitting: Infectious Disease

## 2015-03-24 NOTE — Telephone Encounter (Signed)
This patient was referred to our practice for recurrent STD's.  The reason he is getting them is undoubtedly because he has partners that have not been tested and treated. Also since I believe he is MSM it may be HIGHLY likely that both he and his partners are not being tested in extragenital areas, ie rectally or orally for GC and chlamydia and that one of these other folks in his network is still carrying the chlamydia and passing it back to the patient  He should be seen in clinic with me and we can check OP, Recal and GU.  More than anything else actually REALLY using condoms with all forms of sexual intercourse and making sure that sexual partners are treated would help put a stop to this

## 2015-04-30 ENCOUNTER — Encounter: Payer: Self-pay | Admitting: Infectious Disease

## 2015-05-07 ENCOUNTER — Other Ambulatory Visit: Payer: Self-pay | Admitting: Family Medicine

## 2015-05-07 ENCOUNTER — Encounter: Payer: Self-pay | Admitting: Infectious Disease

## 2015-05-07 NOTE — Telephone Encounter (Signed)
Patient requesting refill. 

## 2015-05-14 ENCOUNTER — Other Ambulatory Visit: Payer: Self-pay | Admitting: Family Medicine

## 2015-05-14 ENCOUNTER — Ambulatory Visit (INDEPENDENT_AMBULATORY_CARE_PROVIDER_SITE_OTHER): Payer: 59 | Admitting: Family Medicine

## 2015-05-14 ENCOUNTER — Encounter: Payer: Self-pay | Admitting: Family Medicine

## 2015-05-14 VITALS — BP 122/68 | HR 97 | Temp 98.2°F | Resp 18 | Wt 165.3 lb

## 2015-05-14 DIAGNOSIS — J302 Other seasonal allergic rhinitis: Secondary | ICD-10-CM

## 2015-05-14 DIAGNOSIS — R21 Rash and other nonspecific skin eruption: Secondary | ICD-10-CM

## 2015-05-14 DIAGNOSIS — Z113 Encounter for screening for infections with a predominantly sexual mode of transmission: Secondary | ICD-10-CM

## 2015-05-14 DIAGNOSIS — J4520 Mild intermittent asthma, uncomplicated: Secondary | ICD-10-CM | POA: Diagnosis not present

## 2015-05-14 MED ORDER — BACITRACIN-NEOMYCIN-POLYMYXIN 400-5-5000 EX OINT: 1.0000 "application " | TOPICAL_OINTMENT | Freq: Two times a day (BID) | CUTANEOUS | Status: DC

## 2015-05-14 MED ORDER — ALBUTEROL SULFATE HFA 108 (90 BASE) MCG/ACT IN AERS: INHALATION_SPRAY | RESPIRATORY_TRACT | Status: DC

## 2015-05-14 NOTE — Progress Notes (Signed)
Name: David Mendez   MRN: [id number]    DOB: [birth year]   Date:05/14/2015       Progress Note  Subjective  Chief Complaint  Chief Complaint  Patient presents with  . Rash    Onset 1 week. Right arm, right side and radiates to abdominal region. Itchy, red, bumpy. Symptoms unchanged.    HPI  Asthma Mild intermittent: he had symptoms wheezing a couple of weeks ago , but it has resolved. He states symptoms are triggered by change in seasons, and worse in the hot months. He takes Singulair daily and Albuterol prn. Less than once a week. Currently no SOB , wheezing or cough  AR: he states he is doing well at this time, no rhinorrhea or nasal congestion  Rash: he states started about one week ago. He has a history of eczema but this is different. He noticed some pimple like bumps on both arms, on antecubital area and proximal arm. Not very itchy or painful. He has not tried any otc medication. Denies going to hotels or other homes where he could have been exposed to bed bugs.   STI screen: he is HIV positive, has recurrent STI's, currently no symptoms. He states he uses condoms. Explained risk of promiscuity.    Patient Active Problem List   Diagnosis Date Noted  . Allergic contact dermatitis 11/09/2014  . Chronic constipation 11/09/2014  . History of gonorrhea 11/09/2014  . HIV positive ([facility]) 11/09/2014  . History of giardia infection 11/09/2014  . HIV disease ([facility]) 08/06/2014  . Asthma, mild intermittent 08/06/2014  . Seasonal allergic rhinitis 08/31/2006    History reviewed. No pertinent past surgical history.  Family History  Problem Relation Age of Onset  . Asthma Mother   . Allergic rhinitis Mother   . Asthma Brother     Younger Brother    Social History   Social History  . Marital Status: Single    Spouse Name: N/A  . Number of Children: N/A  . Years of Education: N/A   Occupational History  . Not on file.   Social History Main Topics  . Smoking status:  Never Smoker   . Smokeless tobacco: Never Used  . Alcohol Use: No  . Drug Use: No  . Sexual Activity: Yes   Other Topics Concern  . Not on file   Social History Narrative     Current outpatient prescriptions:  .  Abacavir-Dolutegravir-Lamivud (TRIUMEQ) 600-50-300 MG TABS, Take 1 tablet by mouth daily., Disp: 30 tablet, Rfl: 11 .  albuterol (VENTOLIN HFA) 108 (90 Base) MCG/ACT inhaler, INHALE 2 PUFFS AS NEEDED FOR ASTHMA/WHEEZING, Disp: 18 Inhaler, Rfl: 0 .  cetirizine (ZYRTEC) 10 MG tablet, TAKE 1 TABLET (10 MG TOTAL) BY MOUTH DAILY., Disp: 90 tablet, Rfl: 1 .  ipratropium-albuterol (DUONEB) 0.5-2.5 (3) MG/3ML SOLN, Take 3 mLs by nebulization every 6 (six) hours as needed., Disp: 360 mL, Rfl: 3 .  montelukast (SINGULAIR) 10 MG tablet, TAKE 1 TABLET EVERY DAY, Disp: 30 tablet, Rfl: 5 .  neomycin-bacitracin-polymyxin (NEOSPORIN) ointment, Apply 1 application topically every 12 (twelve) hours. apply to eye, Disp: 15 g, Rfl: 0 .  triamcinolone cream (KENALOG) 0.1 %, Apply topically 2 (two) times daily as needed. Reported on 05/14/2015, Disp: , Rfl: 2  No Known Allergies   ROS  Constitutional: Negative for fever or weight change.  Respiratory: Negative for cough and shortness of breath.   Cardiovascular: Negative for chest pain or palpitations.  Gastrointestinal: Negative for abdominal pain, no  bowel changes.  Musculoskeletal: Negative for gait problem or joint swelling.  Skin: Positive for rash.  Neurological: Negative for dizziness or headache.  No other specific complaints in a complete review of systems (except as listed in HPI above).  Objective  Filed Vitals:   05/14/15 0839  BP: 122/68  Pulse: 97  Temp: 98.2 F (36.8 C)  TempSrc: Oral  Resp: 18  Weight: 165 lb 4.8 oz (74.98 kg)  SpO2: 97%    Body mass index is 26.69 kg/(m^2).  Physical Exam  Constitutional: Patient appears well-developed and well-nourished. No distress.  HEENT: head atraumatic, normocephalic,  pupils equal and reactive to light, neck supple, throat within normal limits Cardiovascular: Normal rate, regular rhythm and normal heart sounds.  No murmur heard. No BLE edema. Pulmonary/Chest: Effort normal and breath sounds normal. No respiratory distress. Abdominal: Soft.  There is no tenderness. Skin: small papules, one of them is pimple like, the others already healing and causing hyperpigmentation.  Psychiatric: Patient has a normal mood and affect. behavior is normal. Judgment and thought content normal.  Recent Results (from the past 2160 hour(s))  Urine culture     Status: None   Collection Time: 03/06/15 12:00 AM  Result Value Ref Range   Urine Culture, Routine Final report    Urine Culture result 1 No growth   Please Note     Status: None   Collection Time: 03/06/15 12:00 AM  Result Value Ref Range   Please note Comment     Comment: The date and/or time of collection was not indicated on the requisition as required by state and federal law.  The date of receipt of the specimen was used as the collection date if not supplied.   GC/Chlamydia Probe Amp     Status: Abnormal   Collection Time: 03/06/15 12:00 AM  Result Value Ref Range   Chlamydia trachomatis, NAA Positive (A) Negative   Neisseria gonorrhoeae by PCR Negative Negative  Specimen status report     Status: None   Collection Time: 03/06/15 12:00 AM  Result Value Ref Range   specimen status report Comment     Comment: Written Authorization Written Authorization Written Authorization Received. Authorization received from ORIGINAL REQ 03-09-2015 Logged by Ruthe Mannan      PHQ2/9: Depression screen [facility] 2/9 05/14/2015 02/05/2015 11/26/2014 11/18/2014 08/06/2014  Decreased Interest 0 0 0 0 0  Down, Depressed, Hopeless 0 0 0 0 0  PHQ - 2 Score 0 0 0 0 0     Fall Risk: Fall Risk  05/14/2015 02/05/2015 11/26/2014 11/18/2014 09/24/2014  Falls in the past year? No No No No No      Functional Status Survey: Is  the patient deaf or have difficulty hearing?: No Does the patient have difficulty seeing, even when wearing glasses/contacts?: No Does the patient have difficulty concentrating, remembering, or making decisions?: No Does the patient have difficulty walking or climbing stairs?: No Does the patient have difficulty dressing or bathing?: No Does the patient have difficulty doing errands alone such as visiting a doctor's office or shopping?: No    Assessment & Plan  1. Intrinsic asthma without status asthmaticus, mild intermittent, uncomplicated  - albuterol (VENTOLIN HFA) 108 (90 Base) MCG/ACT inhaler; INHALE 2 PUFFS AS NEEDED FOR ASTHMA/WHEEZING  Dispense: 18 Inhaler; Refill: 0  2. Rash  Unsure of etiology, it looks like acne. Possibility of bed buts. It does not look like scabies. We will try topical antibiotics, keep area clean, call back if worsening -  neomycin-bacitracin-polymyxin (NEOSPORIN) ointment; Apply 1 application topically every 12 (twelve) hours. apply to eye  Dispense: 15 g; Refill: 0  3. Screen for STD (sexually transmitted disease)  - Chlamydia/Gonococcus/Trichomonas, NAA  4. Seasonal allergic rhinitis  Continue singulair

## 2015-05-16 LAB — CHLAMYDIA/GONOCOCCUS/TRICHOMONAS, NAA
Chlamydia by NAA: NEGATIVE
Gonococcus by NAA: NEGATIVE
TRICH VAG BY NAA: NEGATIVE

## 2015-05-20 ENCOUNTER — Ambulatory Visit: Payer: 59 | Admitting: Family Medicine

## 2015-05-25 ENCOUNTER — Other Ambulatory Visit: Payer: 59

## 2015-05-25 DIAGNOSIS — B20 Human immunodeficiency virus [HIV] disease: Secondary | ICD-10-CM

## 2015-05-25 LAB — COMPLETE METABOLIC PANEL WITH GFR
ALBUMIN: 4.6 g/dL (ref 3.6–5.1)
ALK PHOS: 77 U/L (ref 40–115)
ALT: 40 U/L (ref 9–46)
AST: 20 U/L (ref 10–40)
BILIRUBIN TOTAL: 0.7 mg/dL (ref 0.2–1.2)
BUN: 17 mg/dL (ref 7–25)
CO2: 27 mmol/L (ref 20–31)
Calcium: 9.7 mg/dL (ref 8.6–10.3)
Chloride: 103 mmol/L (ref 98–110)
Creat: 1.02 mg/dL (ref 0.60–1.35)
GLUCOSE: 97 mg/dL (ref 65–99)
POTASSIUM: 4.5 mmol/L (ref 3.5–5.3)
SODIUM: 140 mmol/L (ref 135–146)
Total Protein: 7.3 g/dL (ref 6.1–8.1)

## 2015-05-25 LAB — LIPID PANEL
CHOL/HDL RATIO: 5 ratio (ref <=5.0)
Cholesterol: 169 mg/dL (ref 125–200)
HDL: 34 mg/dL — AB (ref >=40)
LDL Cholesterol: 108 mg/dL (ref <130)
TRIGLYCERIDES: 134 mg/dL (ref <150)
VLDL: 27 mg/dL (ref <30)

## 2015-05-25 LAB — CBC WITH DIFFERENTIAL/PLATELET
BASOS PCT: 1 % (ref 0–1)
Basophils Absolute: 0.1 10*3/uL (ref 0.0–0.1)
EOS ABS: 0.3 10*3/uL (ref 0.0–0.7)
Eosinophils Relative: 4 % (ref 0–5)
HCT: 48.1 % (ref 39.0–52.0)
Hemoglobin: 17 g/dL (ref 13.0–17.0)
LYMPHS ABS: 3.2 10*3/uL (ref 0.7–4.0)
Lymphocytes Relative: 48 % — ABNORMAL HIGH (ref 12–46)
MCH: 31.8 pg (ref 26.0–34.0)
MCHC: 35.3 g/dL (ref 30.0–36.0)
MCV: 89.9 fL (ref 78.0–100.0)
MONO ABS: 0.3 10*3/uL (ref 0.1–1.0)
MONOS PCT: 5 % (ref 3–12)
MPV: 9.9 fL (ref 8.6–12.4)
Neutro Abs: 2.8 10*3/uL (ref 1.7–7.7)
Neutrophils Relative %: 42 % — ABNORMAL LOW (ref 43–77)
PLATELETS: 267 10*3/uL (ref 150–400)
RBC: 5.35 MIL/uL (ref 4.22–5.81)
RDW: 13.2 % (ref 11.5–15.5)
WBC: 6.7 10*3/uL (ref 4.0–10.5)

## 2015-05-26 LAB — T-HELPER CELL (CD4) - (RCID CLINIC ONLY)
CD4 % Helper T Cell: 29 % — ABNORMAL LOW (ref 33–55)
CD4 T CELL ABS: 900 /uL (ref 400–2700)

## 2015-05-26 LAB — HIV-1 RNA QUANT-NO REFLEX-BLD
HIV 1 RNA QUANT: 32 {copies}/mL — AB (ref <20)
HIV-1 RNA QUANT, LOG: 1.51 {Log_copies}/mL — AB (ref <1.30)

## 2015-05-26 LAB — RPR

## 2015-06-08 ENCOUNTER — Encounter: Payer: Self-pay | Admitting: Infectious Disease

## 2015-06-08 ENCOUNTER — Ambulatory Visit (INDEPENDENT_AMBULATORY_CARE_PROVIDER_SITE_OTHER): Payer: 59 | Admitting: Infectious Disease

## 2015-06-08 VITALS — BP 137/87 | HR 80 | Temp 98.2°F | Ht 66.0 in | Wt 166.0 lb

## 2015-06-08 DIAGNOSIS — J4520 Mild intermittent asthma, uncomplicated: Secondary | ICD-10-CM

## 2015-06-08 DIAGNOSIS — B20 Human immunodeficiency virus [HIV] disease: Secondary | ICD-10-CM | POA: Diagnosis not present

## 2015-06-08 DIAGNOSIS — A749 Chlamydial infection, unspecified: Secondary | ICD-10-CM | POA: Diagnosis not present

## 2015-06-08 DIAGNOSIS — Z8619 Personal history of other infectious and parasitic diseases: Secondary | ICD-10-CM | POA: Diagnosis not present

## 2015-06-08 HISTORY — DX: Chlamydial infection, unspecified: A74.9

## 2015-06-08 NOTE — Progress Notes (Signed)
Chief complaint: for HIV on medications  Subjective:    Patient ID: David Mendez, male    DOB: October 08, 1995, 20 y.o.   MRN: [id number]  HPI   [name] is a [age]year-old African-American male with HIV disease who is currently on TRIUMEQ and reasonably virally suppressed  Lab Results  Component Value Date   HIV1RNAQUANT 32* 05/25/2015   HIV1RNAQUANT 92* 11/12/2014   HIV1RNAQUANT 203* 09/10/2014    Lab Results  Component Value Date   CD4TABS 900 05/25/2015   CD4TABS 910 11/12/2014   CD4TABS 840 09/10/2014    He has seen her PCP for chronic asthma management.   He did test + for chlamdia in December and we will [facility] chlamydia from oropharynx, rectum and urine.  Past Medical History  Diagnosis Date  . Asthma   . HIV disease (HCC) 08/06/2014  . Asthma, chronic 08/06/2014  . Allergy   . History of giardia infection   . Industrial dermatitis   . Loss of weight   . HIV antibody positive (HCC)    No past surgical history on file.   Family History  Problem Relation Age of Onset  . Asthma Mother   . Allergic rhinitis Mother   . Asthma Brother     Younger Brother    Social History  Substance Use Topics  . Smoking status: Never Smoker   . Smokeless tobacco: Never Used  . Alcohol Use: No    Current outpatient prescriptions:  .  Abacavir-Dolutegravir-Lamivud (TRIUMEQ) 600-50-300 MG TABS, Take 1 tablet by mouth daily., Disp: 30 tablet, Rfl: 11 .  albuterol (VENTOLIN HFA) 108 (90 Base) MCG/ACT inhaler, INHALE 2 PUFFS AS NEEDED FOR ASTHMA/WHEEZING, Disp: 18 Inhaler, Rfl: 0 .  cetirizine (ZYRTEC) 10 MG tablet, TAKE 1 TABLET (10 MG TOTAL) BY MOUTH DAILY., Disp: 90 tablet, Rfl: 1 .  ipratropium-albuterol (DUONEB) 0.5-2.5 (3) MG/3ML SOLN, Take 3 mLs by nebulization every 6 (six) hours as needed., Disp: 360 mL, Rfl: 3 .  montelukast (SINGULAIR) 10 MG tablet, TAKE 1 TABLET EVERY DAY, Disp: 30 tablet, Rfl: 5  No Known Allergies    Review of Systems    Constitutional: Negative for fever, chills, diaphoresis, activity change, appetite change, fatigue and unexpected weight change.  HENT: Negative for congestion, rhinorrhea, sinus pressure, sneezing, sore throat and trouble swallowing.   Eyes: Negative for photophobia and visual disturbance.  Respiratory: Negative for cough, chest tightness, shortness of breath, wheezing and stridor.   Cardiovascular: Negative for chest pain, palpitations and leg swelling.  Gastrointestinal: Negative for nausea, vomiting, abdominal pain, diarrhea, constipation, blood in stool, abdominal distention and anal bleeding.  Genitourinary: Negative for dysuria, hematuria, flank pain and difficulty urinating.  Musculoskeletal: Negative for myalgias, back pain, joint swelling, arthralgias and gait problem.  Skin: Negative for color change, pallor, rash and wound.  Neurological: Negative for dizziness, tremors, weakness and light-headedness.  Hematological: Negative for adenopathy. Does not bruise/bleed easily.  Psychiatric/Behavioral: Negative for behavioral problems, confusion, sleep disturbance, dysphoric mood, decreased concentration and agitation.       Objective:   Physical Exam  Constitutional: He is oriented to person, place, and time. He appears well-developed and well-nourished.  HENT:  Head: Normocephalic and atraumatic.  Eyes: Conjunctivae and EOM are normal.  Neck: Normal range of motion. Neck supple.  Cardiovascular: Normal rate and regular rhythm.   Pulmonary/Chest: Effort normal. No respiratory distress. He has no wheezes.  Abdominal: Soft. He exhibits no distension.  Musculoskeletal: Normal range of motion.  He exhibits no edema or tenderness.  Neurological: He is alert and oriented to person, place, and time.  Skin: Skin is warm and dry. No rash noted. No erythema. No pallor.  Psychiatric: He has a normal mood and affect. His behavior is normal. Judgment and thought content normal.           Assessment & Plan:   HIV disease: continue TRIUMEQ and bring back in 6 months time for repeat labs.   Asthma: Mild intermittent and not requiring corticosteroids or daily bronchodilators.   Hx of Gonorrhea and chlamydia: will recheck for both genital and extragenital   I spent greater than 25 minutes with the patient including greater than 50% of time in face to face counsel of the patient re his HIV, asthma, GC and in coordination of their care.    Acey Lav, MD

## 2015-06-09 ENCOUNTER — Telehealth: Payer: Self-pay | Admitting: *Deleted

## 2015-06-09 LAB — URINE CYTOLOGY ANCILLARY ONLY
Chlamydia: NEGATIVE
Neisseria Gonorrhea: NEGATIVE

## 2015-06-09 LAB — CYTOLOGY, (ORAL, ANAL, URETHRAL) ANCILLARY ONLY
Chlamydia: NEGATIVE
Chlamydia: POSITIVE — AB
NEISSERIA GONORRHEA: NEGATIVE
Neisseria Gonorrhea: NEGATIVE

## 2015-06-09 NOTE — Telephone Encounter (Signed)
Patient will come in 3/22 for nurse visit.  He would prefer the 1 gm azithromycin. Andree CossHowell, Areliz Rothman M, RN

## 2015-06-09 NOTE — Telephone Encounter (Signed)
-----   Message from Randall Hissornelius N Van Dam, MD sent at 06/09/2015  4:03 PM EDT ----- Patient needs treatment for chlamydia yet again. Options are 1 gram azithromycin or he can have doxycline 100mg  twice a day for 2 weeks His partners need to be tested and treated. Condom use would be helpful. Fortunately no GC this time

## 2015-06-10 ENCOUNTER — Ambulatory Visit (INDEPENDENT_AMBULATORY_CARE_PROVIDER_SITE_OTHER): Payer: 59 | Admitting: *Deleted

## 2015-06-10 DIAGNOSIS — A749 Chlamydial infection, unspecified: Secondary | ICD-10-CM

## 2015-06-10 MED ORDER — AZITHROMYCIN 250 MG PO TABS
1000.0000 mg | ORAL_TABLET | Freq: Once | ORAL | Status: AC
  Administered 2015-06-10: 1000 mg via ORAL

## 2015-07-07 ENCOUNTER — Ambulatory Visit (INDEPENDENT_AMBULATORY_CARE_PROVIDER_SITE_OTHER): Payer: 59 | Admitting: Family Medicine

## 2015-07-07 ENCOUNTER — Encounter: Payer: Self-pay | Admitting: Family Medicine

## 2015-07-07 VITALS — BP 116/84 | HR 79 | Temp 98.2°F | Resp 16 | Ht 66.0 in | Wt 165.2 lb

## 2015-07-07 DIAGNOSIS — Z23 Encounter for immunization: Secondary | ICD-10-CM

## 2015-07-07 DIAGNOSIS — E786 Lipoprotein deficiency: Secondary | ICD-10-CM | POA: Diagnosis not present

## 2015-07-07 DIAGNOSIS — Z0000 Encounter for general adult medical examination without abnormal findings: Secondary | ICD-10-CM

## 2015-07-07 NOTE — Progress Notes (Signed)
Name: David Mendez   MRN: [id number]    DOB: December 08, 1995   Date:07/07/2015       Progress Note  Subjective  Chief Complaint  Chief Complaint  Patient presents with  . Annual Exam    HPI  Well Male: he is feeling well, no asthma problems, no penile discharge. Up to date with follow up with ID and CD4 count is improving, and HIV count is going down. Not currently sexually active.   Patient Active Problem List   Diagnosis Date Noted  . Chlamydia 06/08/2015  . Allergic contact dermatitis 11/09/2014  . Chronic constipation 11/09/2014  . History of gonorrhea 11/09/2014  . HIV positive (HCC) 11/09/2014  . History of giardia infection 11/09/2014  . HIV disease (HCC) 08/06/2014  . Asthma, mild intermittent 08/06/2014  . Seasonal allergic rhinitis 08/31/2006    History reviewed. No pertinent past surgical history.  Family History  Problem Relation Age of Onset  . Asthma Mother   . Allergic rhinitis Mother   . Asthma Brother     Younger Brother    Social History   Social History  . Marital Status: Single    Spouse Name: N/A  . Number of Children: N/A  . Years of Education: N/A   Occupational History  . Not on file.   Social History Main Topics  . Smoking status: Never Smoker   . Smokeless tobacco: Never Used  . Alcohol Use: No  . Drug Use: No  . Sexual Activity: Yes   Other Topics Concern  . Not on file   Social History Narrative     Current outpatient prescriptions:  .  Abacavir-Dolutegravir-Lamivud (TRIUMEQ) 600-50-300 MG TABS, Take 1 tablet by mouth daily., Disp: 30 tablet, Rfl: 11 .  albuterol (VENTOLIN HFA) 108 (90 Base) MCG/ACT inhaler, INHALE 2 PUFFS AS NEEDED FOR ASTHMA/WHEEZING, Disp: 18 Inhaler, Rfl: 0 .  cetirizine (ZYRTEC) 10 MG tablet, TAKE 1 TABLET (10 MG TOTAL) BY MOUTH DAILY., Disp: 90 tablet, Rfl: 1 .  ipratropium-albuterol (DUONEB) 0.5-2.5 (3) MG/3ML SOLN, Take 3 mLs by nebulization every 6 (six) hours as needed., Disp: 360 mL, Rfl: 3 .   montelukast (SINGULAIR) 10 MG tablet, TAKE 1 TABLET EVERY DAY, Disp: 30 tablet, Rfl: 5  No Known Allergies   ROS  Constitutional: Negative for fever or weight change.  Respiratory: Negative for cough and shortness of breath.   Cardiovascular: Negative for chest pain or palpitations.  Gastrointestinal: Negative for abdominal pain, no bowel changes.  Musculoskeletal: Negative for gait problem or joint swelling.  Skin: Positive for rash -eczema on his right inner thigh and will resume topical medication .  Neurological: Negative for dizziness or headache.  No other specific complaints in a complete review of systems (except as listed in HPI above).  Objective  Filed Vitals:   07/07/15 0825  BP: 116/84  Pulse: 79  Temp: 98.2 F (36.8 C)  TempSrc: Oral  Resp: 16  Height:  (1.676 m)  Weight: 165 lb 3.2 oz (74.934 kg)  SpO2: 97%    Body mass index is 26.68 kg/(m^2).  Physical Exam  Constitutional: Patient appears well-developed and well-nourished. No distress.  HENT: Head: Normocephalic and atraumatic. Ears: B TMs ok, no erythema or effusion; Nose: Nose normal. Mouth/Throat: Oropharynx is clear and moist. No oropharyngeal exudate.  Eyes: Conjunctivae and EOM are normal. Pupils are equal, round, and reactive to light. No scleral icterus.  Neck: Normal range of motion. Neck supple. No JVD present. No thyromegaly present.  Cardiovascular: Normal rate, regular rhythm and normal heart sounds.  No murmur heard. No BLE edema. Pulmonary/Chest: Effort normal and breath sounds normal. No respiratory distress. Abdominal: Soft. Bowel sounds are normal, no distension. There is no tenderness. no masses MALE GENITALIA: Normal descended testes bilaterally, no masses palpated, no hernias, no lesions, no discharge RECTAL: not done Musculoskeletal: Normal range of motion, no joint effusions. No gross deformities Neurological: he is alert and oriented to person, place, and time. No cranial  nerve deficit. Coordination, balance, strength, speech and gait are normal.  Skin: Skin is warm and dry. Mild erythematous rash on right inner thigh.  Psychiatric: Patient has a normal mood and affect. behavior is normal. Judgment and thought content normal.    PHQ2/9: Depression screen [facility] 2/9 06/08/2015 05/14/2015 02/05/2015 11/26/2014 11/18/2014  Decreased Interest 0 0 0 0 0  Down, Depressed, Hopeless 0 0 0 0 0  PHQ - 2 Score 0 0 0 0 0     Fall Risk: Fall Risk  06/08/2015 05/14/2015 02/05/2015 11/26/2014 11/18/2014  Falls in the past year? No No No No No      Assessment & Plan  1. Annual physical exam  Discussed importance of 150 minutes of physical activity weekly, eat two servings of fish weekly, eat one serving of tree nuts ( cashews, pistachios, pecans, almonds.Marland Kitchen.) every other day, eat 6 servings of fruit/vegetables daily and drink plenty of water and avoid sweet beverages.   2. Need for Tdap vaccination  - Tdap vaccine greater than or equal to 7yo IM  3. Low HDL (under 40)  Lipid panel shows low HDL : to improve HDL patient  needs to eat tree nuts ( pecans/pistachios/almonds ) four times weekly, eat fish two times weekly  and exercise  at least 150 minutes per week

## 2015-07-21 ENCOUNTER — Other Ambulatory Visit: Payer: Self-pay | Admitting: Family Medicine

## 2015-07-21 DIAGNOSIS — J4520 Mild intermittent asthma, uncomplicated: Secondary | ICD-10-CM

## 2015-07-21 MED ORDER — ALBUTEROL SULFATE HFA 108 (90 BASE) MCG/ACT IN AERS: INHALATION_SPRAY | RESPIRATORY_TRACT | Status: DC

## 2015-07-21 NOTE — Telephone Encounter (Signed)
Refill request was sent to Dr. Krichna Sowles for approval and submission.  

## 2015-07-21 NOTE — Telephone Encounter (Signed)
PT IS NEEDING REFILL ON ALBUTEROL. PHARM IS [facility] [address]

## 2015-08-04 ENCOUNTER — Other Ambulatory Visit: Payer: Self-pay | Admitting: Infectious Disease

## 2015-08-04 DIAGNOSIS — B20 Human immunodeficiency virus [HIV] disease: Secondary | ICD-10-CM

## 2015-08-12 ENCOUNTER — Ambulatory Visit: Payer: 59 | Admitting: Family Medicine

## 2015-09-09 ENCOUNTER — Ambulatory Visit: Payer: 59 | Admitting: Family Medicine

## 2015-10-06 ENCOUNTER — Ambulatory Visit (INDEPENDENT_AMBULATORY_CARE_PROVIDER_SITE_OTHER): Payer: 59 | Admitting: Family Medicine

## 2015-10-06 ENCOUNTER — Encounter: Payer: Self-pay | Admitting: Family Medicine

## 2015-10-06 VITALS — BP 124/76 | HR 82 | Temp 98.3°F | Resp 16 | Ht 66.0 in | Wt 166.3 lb

## 2015-10-06 DIAGNOSIS — E786 Lipoprotein deficiency: Secondary | ICD-10-CM

## 2015-10-06 DIAGNOSIS — H00013 Hordeolum externum right eye, unspecified eyelid: Secondary | ICD-10-CM | POA: Diagnosis not present

## 2015-10-06 DIAGNOSIS — J4520 Mild intermittent asthma, uncomplicated: Secondary | ICD-10-CM

## 2015-10-06 DIAGNOSIS — J302 Other seasonal allergic rhinitis: Secondary | ICD-10-CM | POA: Diagnosis not present

## 2015-10-06 DIAGNOSIS — B20 Human immunodeficiency virus [HIV] disease: Secondary | ICD-10-CM

## 2015-10-06 DIAGNOSIS — R21 Rash and other nonspecific skin eruption: Secondary | ICD-10-CM

## 2015-10-06 MED ORDER — IPRATROPIUM-ALBUTEROL 0.5-2.5 (3) MG/3ML IN SOLN: 3.0000 mL | Freq: Four times a day (QID) | RESPIRATORY_TRACT | Status: DC | PRN

## 2015-10-06 NOTE — Progress Notes (Signed)
Name: David Mendez   MRN: [id number]    DOB: 05-01-1995   Date:10/06/2015       Progress Note  Subjective  Chief Complaint  Chief Complaint  Patient presents with  . Follow-up    patient is here for his 53-month f/u  . Asthma    patient stated that it has been up and down. some wheezing and SOB.  Marland Kitchen Allergic Rhinitis     controlled  . Skin Problem    patient stated that he has dry skin under his arms that peels and burns at times  . Stye    patient stated that he has a stye on his right eye. he stated that it has came and gone twice.    HPI  Asthma Mild intermittent: he is doing well now, he states that a couple of weeks ago he noticed some SOB with activity, but no wheezing or symptoms of an URI. He uses Ventolin prn only about once every 1 months, he likes to have Duoneb when he has an asthma flare, and states refill not at his pharmacy. He is still taking singulair daily   AR: he states he is doing well at this time, no rhinorrhea,  nasal congestion or sneezing  HIV: sees ID at [facility] Dr. Daiva Eves- he states he is very compliant with his medication regiment, no side effects, appetite is good and his weight has been stable, due for follow up with the HIV clinic and have repeat labs. He states he will contact them to set it up.   Rash: he has a history of eczema, and over the past couple of weeks he has noticed a pilling rash on bilateral axilla, itchy when sweaty, no other rashes or fever. He stopped using deodorant and symptoms has improved. Advised to try Tom's and topical otc steroid cream for about 5 days and stop.  Sty: he noticed a tender bump on right superior eyelid a couple of weeks ago, resolved after a few days and returned on the same spot 5 days ago, the swelling and pain is going down. Discussed warm compresses, and refer to ophthalmologist if it returns or does not completely resolve   Patient Active Problem List   Diagnosis Date Noted  . Low HDL (under 40)  07/07/2015  . Allergic contact dermatitis 11/09/2014  . Chronic constipation 11/09/2014  . History of gonorrhea 11/09/2014  . HIV positive (HCC) 11/09/2014  . History of giardia infection 11/09/2014  . HIV disease (HCC) 08/06/2014  . Asthma, mild intermittent 08/06/2014  . Seasonal allergic rhinitis 08/31/2006    History reviewed. No pertinent past surgical history.  Family History  Problem Relation Age of Onset  . Asthma Mother   . Allergic rhinitis Mother   . Asthma Brother     Younger Brother    Social History   Social History  . Marital Status: Single    Spouse Name: N/A  . Number of Children: N/A  . Years of Education: N/A   Occupational History  . Not on file.   Social History Main Topics  . Smoking status: Never Smoker   . Smokeless tobacco: Never Used  . Alcohol Use: No  . Drug Use: No  . Sexual Activity:    Partners: Male    Birth Control/ Protection: Condom   Other Topics Concern  . Not on file   Social History Narrative     Current outpatient prescriptions:  .  albuterol (VENTOLIN HFA) 108 (90 Base) MCG/ACT  inhaler, INHALE 2 PUFFS AS NEEDED FOR ASTHMA/WHEEZING, Disp: 18 Inhaler, Rfl: 0 .  cetirizine (ZYRTEC) 10 MG tablet, TAKE 1 TABLET (10 MG TOTAL) BY MOUTH DAILY., Disp: 90 tablet, Rfl: 1 .  ipratropium-albuterol (DUONEB) 0.5-2.5 (3) MG/3ML SOLN, Take 3 mLs by nebulization every 6 (six) hours as needed., Disp: 360 mL, Rfl: 0 .  montelukast (SINGULAIR) 10 MG tablet, TAKE 1 TABLET EVERY DAY, Disp: 30 tablet, Rfl: 5 .  TRIUMEQ 600-50-300 MG tablet, TAKE 1 TABLET BY MOUTH DAILY., Disp: 30 tablet, Rfl: 4  No Known Allergies   ROS  Constitutional: Negative for fever or weight change.  Respiratory: Negative for cough and shortness of breath.   Cardiovascular: Negative for chest pain or palpitations.  Gastrointestinal: Negative for abdominal pain, no bowel changes.  Musculoskeletal: Negative for gait problem or joint swelling.  Skin: Negative for  rash.  Neurological: Negative for dizziness or headache.  No other specific complaints in a complete review of systems (except as listed in HPI above).  Objective  Filed Vitals:   10/06/15 0856  BP: 124/76  Pulse: 82  Temp: 98.3 F (36.8 C)  TempSrc: Oral  Resp: 16  Height: 5\' 6"  (1.676 m)  Weight: 166 lb 4.8 oz (75.433 kg)  SpO2: 98%    Body mass index is 26.85 kg/(m^2).  Physical Exam  Constitutional: Patient appears well-developed and well-nourished. No distress.  HEENT: head atraumatic, normocephalic, pupils equal and reactive to light, neck supple, throat within normal limits Cardiovascular: Normal rate, regular rhythm and normal heart sounds.  No murmur heard. No BLE edema. Pulmonary/Chest: Effort normal and breath sounds normal. No respiratory distress. Abdominal: Soft.  There is no tenderness. Psychiatric: Patient has a normal mood and affect. behavior is normal. Judgment and thought content normal.  PHQ2/9: [facility] 10/06/2015 06/08/2015 05/14/2015 02/05/2015 11/26/2014  Decreased Interest 0 0 0 0 0  Down, Depressed, Hopeless 0 0 0 0 0  PHQ - 2 Score 0 0 0 0 0    Fall Risk: Fall Risk  10/06/2015 06/08/2015 05/14/2015 02/05/2015 11/26/2014  Falls in the past year? No No No No No    Functional Status Survey: Is the patient deaf or have difficulty hearing?: No Does the patient have difficulty seeing, even when wearing glasses/contacts?: No Does the patient have difficulty concentrating, remembering, or making decisions?: No Does the patient have difficulty walking or climbing stairs?: No Does the patient have difficulty dressing or bathing?: No Does the patient have difficulty doing errands alone such as visiting a doctor's office or shopping?: No    Assessment & Plan  1. Low HDL (under 40)  Lipid panel shows low HDL : to improve HDL patient  needs to eat tree nuts ( pecans/pistachios/almonds ) four times weekly, eat fish two times weekly  and  exercise  at least 150 minutes per week   2. Intrinsic asthma without status asthmaticus, mild intermittent, uncomplicated  - ipratropium-albuterol (DUONEB) 0.5-2.5 (3) MG/3ML SOLN; Take 3 mLs by nebulization every 6 (six) hours as needed.  Dispense: 360 mL; Refill: 0  3. Seasonal allergic rhinitis  Doing well continue medication   4. HIV disease (HCC)  Continue follow up with ID, offered to recheck for genprobe but he states he is doing well now and prefers not to have it done  5. Sty, right  Call back for referral to ophthalmologist if no resolution  6. Rash  Try otc hydrocortisone and otc Tom's deodorant

## 2015-10-06 NOTE — Patient Instructions (Signed)
Hydrocortisone 0.1% cream otc for rash - use for a few days to 5 days and stop. May use twice daily  Change to Tom's deodorant

## 2015-11-18 ENCOUNTER — Other Ambulatory Visit: Payer: 59

## 2015-11-24 ENCOUNTER — Emergency Department (HOSPITAL_COMMUNITY)
Admission: EM | Admit: 2015-11-24 | Discharge: 2015-11-24 | Disposition: A | Discharge status: Home / Self Care | Payer: 59 | Attending: Emergency Medicine | Admitting: Emergency Medicine

## 2015-11-24 ENCOUNTER — Encounter (HOSPITAL_COMMUNITY): Payer: Self-pay | Admitting: *Deleted

## 2015-11-24 DIAGNOSIS — J45901 Unspecified asthma with (acute) exacerbation: Secondary | ICD-10-CM | POA: Insufficient documentation

## 2015-11-24 DIAGNOSIS — J4520 Mild intermittent asthma, uncomplicated: Secondary | ICD-10-CM

## 2015-11-24 DIAGNOSIS — R062 Wheezing: Secondary | ICD-10-CM | POA: Diagnosis present

## 2015-11-24 MED ORDER — ALBUTEROL SULFATE (2.5 MG/3ML) 0.083% IN NEBU
5.0000 mg | INHALATION_SOLUTION | Freq: Once | RESPIRATORY_TRACT | Status: AC
  Administered 2015-11-24: 5 mg via RESPIRATORY_TRACT
  Filled 2015-11-24: qty 6

## 2015-11-24 MED ORDER — PREDNISONE 20 MG PO TABS
60.0000 mg | ORAL_TABLET | Freq: Once | ORAL | Status: AC
  Administered 2015-11-24: 60 mg via ORAL
  Filled 2015-11-24: qty 3

## 2015-11-24 MED ORDER — ALBUTEROL SULFATE (2.5 MG/3ML) 0.083% IN NEBU: 2.5000 mg | INHALATION_SOLUTION | Freq: Four times a day (QID) | RESPIRATORY_TRACT | 12 refills | Status: DC | PRN

## 2015-11-24 MED ORDER — PREDNISONE 20 MG PO TABS: 60.0000 mg | ORAL_TABLET | Freq: Every day | ORAL | 0 refills | Status: DC

## 2015-11-24 NOTE — ED Triage Notes (Signed)
Pt c/o asthma x 1 week, worsening tonight. Has used inhaler without improvement. Wheezing noted throughout

## 2015-11-24 NOTE — ED Provider Notes (Signed)
TIME SEEN: 4:15 AM  CHIEF COMPLAINT: Asthma exacerbation  HPI: Pt is a 20 y.o. male with history of asthma, HIV who presents to the emergency department with one week of wheezing, dry cough similar to his prior asthma exacerbations. No chest pain or shortness of breath. No fever. No lower extremity swelling or pain. States he has not had his albuterol inhaler for the past several days but will pick it up from the pharmacy tomorrow. No sick contacts or recent travel. Feels exactly like his prior asthma exacerbations.  ROS: See HPI Constitutional: no fever  Eyes: no drainage  ENT: no runny nose   Cardiovascular:  no chest pain  Resp: no SOB  GI: no vomiting GU: no dysuria Integumentary: no rash  Allergy: no hives  Musculoskeletal: no leg swelling  Neurological: no slurred speech ROS otherwise negative  PAST MEDICAL HISTORY/PAST SURGICAL HISTORY:  Past Medical History:  Diagnosis Date  . Allergy   . Asthma   . Asthma, chronic 08/06/2014  . Chlamydia 06/08/2015  . History of giardia infection   . HIV antibody positive (HCC)   . HIV disease (HCC) 08/06/2014  . Industrial dermatitis   . Loss of weight     MEDICATIONS:  Prior to Admission medications   Medication Sig Start Date End Date Taking? Authorizing Provider  albuterol (VENTOLIN HFA) 108 (90 Base) MCG/ACT inhaler INHALE 2 PUFFS AS NEEDED FOR ASTHMA/WHEEZING 07/21/15   Alba Cory, MD  cetirizine (ZYRTEC) 10 MG tablet TAKE 1 TABLET (10 MG TOTAL) BY MOUTH DAILY. 05/07/15   Alba Cory, MD  ipratropium-albuterol (DUONEB) 0.5-2.5 (3) MG/3ML SOLN Take 3 mLs by nebulization every 6 (six) hours as needed. 10/06/15   Alba Cory, MD  montelukast (SINGULAIR) 10 MG tablet TAKE 1 TABLET EVERY DAY 01/07/15   Alba Cory, MD  TRIUMEQ 600-50-300 MG tablet TAKE 1 TABLET BY MOUTH DAILY. 08/04/15   Randall Hiss, MD    ALLERGIES:  No Known Allergies  SOCIAL HISTORY:  Social History  Substance Use Topics  . Smoking status:  Never Smoker  . Smokeless tobacco: Never Used  . Alcohol use No    FAMILY HISTORY: Family History  Problem Relation Age of Onset  . Asthma Mother   . Allergic rhinitis Mother   . Asthma Brother     Younger Brother    EXAM: BP 133/86   Pulse 67   Temp 98 F (36.7 C) (Oral)   Resp 17   Ht 5\' 6"  (1.676 m)   Wt 161 lb (73 kg)   SpO2 96%   BMI 25.99 kg/m  CONSTITUTIONAL: Alert and oriented and responds appropriately to questions. Well-appearing; well-nourished HEAD: Normocephalic EYES: Conjunctivae clear, PERRL ENT: normal nose; no rhinorrhea; moist mucous membranes NECK: Supple, no meningismus, no LAD  CARD: RRR; S1 and S2 appreciated; no murmurs, no clicks, no rubs, no gallops RESP: Normal chest excursion without splinting or tachypnea; breath sounds clear and equal bilaterally; no wheezes, no rhonchi, no rales, no hypoxia or respiratory distress, speaking full sentences ABD/GI: Normal bowel sounds; non-distended; soft, non-tender, no rebound, no guarding, no peritoneal signs BACK:  The back appears normal and is non-tender to palpation, there is no CVA tenderness EXT: Normal ROM in all joints; non-tender to palpation; no edema; normal capillary refill; no cyanosis, no calf tenderness or swelling    SKIN: Normal color for age and race; warm; no rash NEURO: Moves all extremities equally, sensation to light touch intact diffusely, cranial nerves II through XII intact  PSYCH: The patient's mood and manner are appropriate. Grooming and personal hygiene are appropriate.  MEDICAL DECISION MAKING: Patient here with mild asthma exacerbation. Nursing notes documented the patient was wheezing in triage. He was given a DuoNeb treatment prior to me seeing him and now his lungs are completely clear and he is asymptomatic. We'll discharge with refill for his albuterol for his nebulizer machine at home and a prednisone burst. Discussed with him return precautions. No chest pain or shortness of  breath. No fever. No hypoxia, increased work of breathing or respiratory distress. I do not feel this time he needs a chest x-ray given he has clear, equal breath sounds bilaterally. Discussed with him return precautions. He is comfortable with this plan for discharge home.  At this time, I do not feel there is any life-threatening condition present. I have reviewed and discussed all results (EKG, imaging, lab, urine as appropriate), exam findings with patient/family. I have reviewed nursing notes and appropriate previous records.  I feel the patient is safe to be discharged home without further emergent workup and can continue workup as an outpatient as needed. Discussed usual and customary return precautions. Patient/family verbalize understanding and are comfortable with this plan.  Outpatient follow-up has been provided. All questions have been answered.     Layla MawKristen N Mixtli Reno, DO 11/24/15 [phone]

## 2015-12-02 ENCOUNTER — Ambulatory Visit: Payer: 59 | Admitting: Infectious Disease

## 2016-01-07 ENCOUNTER — Ambulatory Visit: Payer: 59 | Admitting: Family Medicine

## 2016-01-23 ENCOUNTER — Other Ambulatory Visit: Payer: Self-pay | Admitting: Infectious Disease

## 2016-01-23 DIAGNOSIS — B20 Human immunodeficiency virus [HIV] disease: Secondary | ICD-10-CM

## 2016-02-15 ENCOUNTER — Other Ambulatory Visit: Payer: 59

## 2016-02-18 ENCOUNTER — Ambulatory Visit (INDEPENDENT_AMBULATORY_CARE_PROVIDER_SITE_OTHER): Payer: 59 | Admitting: Family Medicine

## 2016-02-18 ENCOUNTER — Encounter: Payer: Self-pay | Admitting: Family Medicine

## 2016-02-18 VITALS — BP 114/80 | HR 95 | Temp 98.6°F | Resp 18 | Ht 66.0 in | Wt 172.3 lb

## 2016-02-18 DIAGNOSIS — J4540 Moderate persistent asthma, uncomplicated: Secondary | ICD-10-CM | POA: Diagnosis not present

## 2016-02-18 DIAGNOSIS — Z202 Contact with and (suspected) exposure to infections with a predominantly sexual mode of transmission: Secondary | ICD-10-CM | POA: Diagnosis not present

## 2016-02-18 DIAGNOSIS — R369 Urethral discharge, unspecified: Secondary | ICD-10-CM | POA: Diagnosis not present

## 2016-02-18 DIAGNOSIS — E786 Lipoprotein deficiency: Secondary | ICD-10-CM | POA: Diagnosis not present

## 2016-02-18 DIAGNOSIS — B20 Human immunodeficiency virus [HIV] disease: Secondary | ICD-10-CM | POA: Diagnosis not present

## 2016-02-18 DIAGNOSIS — Z23 Encounter for immunization: Secondary | ICD-10-CM | POA: Diagnosis not present

## 2016-02-18 MED ORDER — AZITHROMYCIN 250 MG PO TABS
1000.0000 mg | ORAL_TABLET | Freq: Once | ORAL | Status: AC
  Administered 2016-02-18: 1000 mg via ORAL

## 2016-02-18 MED ORDER — ALBUTEROL SULFATE HFA 108 (90 BASE) MCG/ACT IN AERS: INHALATION_SPRAY | RESPIRATORY_TRACT | 0 refills | Status: DC

## 2016-02-18 MED ORDER — CEFTRIAXONE SODIUM 250 MG IJ SOLR
250.0000 mg | Freq: Once | INTRAMUSCULAR | Status: AC
  Administered 2016-02-18: 250 mg via INTRAMUSCULAR

## 2016-02-18 MED ORDER — FLUTICASONE FUROATE-VILANTEROL 100-25 MCG/INH IN AEPB: 1.0000 | INHALATION_SPRAY | Freq: Every day | RESPIRATORY_TRACT | 2 refills | Status: DC

## 2016-02-18 MED ORDER — ALBUTEROL SULFATE (2.5 MG/3ML) 0.083% IN NEBU
2.5000 mg | INHALATION_SOLUTION | Freq: Once | RESPIRATORY_TRACT | Status: AC
  Administered 2016-02-18: 2.5 mg via RESPIRATORY_TRACT

## 2016-02-18 NOTE — Progress Notes (Signed)
Name: David Mendez   MRN: [id number]    DOB: Mar 08, 1996   Date:02/18/2016       Progress Note  Subjective  Chief Complaint  Chief Complaint  Patient presents with  . Exposure to STD    pt having discharge, pt stated that he had went to a party and had drinks and does not remember anything afterwards and began having symptoms last week  . Asthma    HPI  Asthma: usually mild intermittent, however over the past couple months he has been noticing nocturnal symptoms such as dry cough and wheezing, also after exercise. He was initially using rescue inhaler every other day and is currently using at least once a week. He denies fever, productive cough, change in appetite or weight loss  [facility]- he states he is very compliant with his medication regiment, no side effects, appetite is good and he has gained 11 lbs since last visit.He was diagnosed in 06/2014. He is still sexually active, states uses condoms and partners are aware of his diagnosed.   Penile discharge: history of recurrent gonorrhea and chlamydia infections, had intercourse two weeks ago and developed a white penile discharge last week, no itching. We will check urine and treat empirically. Explained importance of monogamous relationship to avoid spreading HIV and contracting other STD's  Low HDL: he is trying to eat more fish and some tree nuts to increase good cholesterol, avoiding fried food and fast food  Patient Active Problem List   Diagnosis Date Noted  . Low HDL (under 40) 07/07/2015  . Allergic contact dermatitis 11/09/2014  . Chronic constipation 11/09/2014  . History of gonorrhea 11/09/2014  . HIV positive (HCC) 11/09/2014  . History of giardia infection 11/09/2014  . HIV disease (HCC) 08/06/2014  . Asthma, mild intermittent 08/06/2014  . Seasonal allergic rhinitis 08/31/2006    History reviewed. No pertinent surgical history.  Family History  Problem Relation Age of Onset  .  Asthma Mother   . Allergic rhinitis Mother   . Asthma Brother     Younger Brother    Social History   Social History  . Marital status: Single    Spouse name: N/A  . Number of children: N/A  . Years of education: N/A   Occupational History  . Not on file.   Social History Main Topics  . Smoking status: Never Smoker  . Smokeless tobacco: Never Used  . Alcohol use 0.0 oz/week     Comment: very seldom, but binges sometimes  . Drug use: No  . Sexual activity: Yes    Partners: Male    Birth control/ protection: Condom   Other Topics Concern  . Not on file   Social History Narrative  . No narrative on file     Current Outpatient Prescriptions:  .  albuterol (PROVENTIL) (2.5 MG/3ML) 0.083% nebulizer solution, Take 3 mLs (2.5 mg total) by nebulization every 6 (six) hours as needed for wheezing or shortness of breath., Disp: 75 mL, Rfl: 12 .  albuterol (VENTOLIN HFA) 108 (90 Base) MCG/ACT inhaler, INHALE 2 PUFFS AS NEEDED FOR ASTHMA/WHEEZING, Disp: 18 Inhaler, Rfl: 0 .  cetirizine (ZYRTEC) 10 MG tablet, TAKE 1 TABLET (10 MG TOTAL) BY MOUTH DAILY., Disp: 90 tablet, Rfl: 1 .  fluticasone furoate-vilanterol (BREO ELLIPTA) 100-25 MCG/INH AEPB, Inhale 1 puff into the lungs daily., Disp: 60 each, Rfl: 2 .  montelukast (SINGULAIR) 10 MG tablet, TAKE 1 TABLET EVERY DAY, Disp: 30 tablet,  Rfl: 5 .  TRIUMEQ 600-50-300 MG tablet, TAKE 1 TABLET BY MOUTH DAILY. (PLEASE SCHEDULE FOLLOW UP APPT [phone]), Disp: 30 tablet, Rfl: 2  No Known Allergies   ROS  Constitutional: Negative for fever or weight change.  Respiratory: Positive  for cough and occasional wheezing Cardiovascular: Negative for chest pain or palpitations.  Gastrointestinal: Negative for abdominal pain, no bowel changes.  Musculoskeletal: Negative for gait problem or joint swelling.  Skin: Negative for rash.  Neurological: Negative for dizziness or headache.  No other specific complaints in a complete review of systems  (except as listed in HPI above).  Objective  Vitals:   02/18/16 1011  BP: 114/80  Pulse: 95  Resp: 18  Temp: 98.6 F (37 C)  TempSrc: Oral  SpO2: 97%  Weight: 172 lb 5 oz (78.2 kg)  Height: 5\' 6"  (1.676 m)    Body mass index is 27.81 kg/m.  Physical Exam  Constitutional: Patient appears well-developed and well-nourished. No distress.  HEENT: head atraumatic, normocephalic, pupils equal and reactive to light,neck supple, throat within normal limits Cardiovascular: Normal rate, regular rhythm and normal heart sounds.  No murmur heard. No BLE edema. Pulmonary/Chest: Effort normal and breath sounds normal. No respiratory distress. Abdominal: Soft.  There is no tenderness. Psychiatric: Patient has a normal mood and affect. behavior is normal. Judgment and thought content normal. Genito urinary: not done   PHQ2/9: Depression screen [facility] 2/9 10/06/2015 06/08/2015 05/14/2015 02/05/2015 11/26/2014  Decreased Interest 0 0 0 0 0  Down, Depressed, Hopeless 0 0 0 0 0  PHQ - 2 Score 0 0 0 0 0     Fall Risk: Fall Risk  10/06/2015 06/08/2015 05/14/2015 02/05/2015 11/26/2014  Falls in the past year? No No No No No   Assessment & Plan  1. Intrinsic asthma without status asthmaticus, mild intermittent, uncomplicated  - Spirometry: Pre & Post Eval - moderate asthma now, we will add Breo - fluticasone furoate-vilanterol (BREO ELLIPTA) 100-25 MCG/INH AEPB; Inhale 1 puff into the lungs daily.  Dispense: 60 each; Refill: 2 - albuterol (VENTOLIN HFA) 108 (90 Base) MCG/ACT inhaler; INHALE 2 PUFFS AS NEEDED FOR ASTHMA/WHEEZING  Dispense: 18 Inhaler; Refill: 0  2. Exposure to STD   3. HIV disease (HCC)  Continue follow up with ID  4. Penile discharge   - cefTRIAXone (ROCEPHIN) injection 250 mg; Inject 250 mg into the muscle once. - azithromycin (ZITHROMAX) tablet 1,000 mg; Take 4 tablets (1,000 mg total) by mouth once. - Chlamydia/Gonococcus/Trichomonas, NAA  5. Low HDL (under  40)  Discussed life style modification again   6. Needs flu shot  - Flu Vaccine QUAD 36+ mos IM

## 2016-02-24 LAB — CHLAMYDIA/GONOCOCCUS/TRICHOMONAS, NAA
Chlamydia by NAA: POSITIVE — AB
Gonococcus by NAA: NEGATIVE
Trich vag by NAA: NEGATIVE

## 2016-02-24 LAB — PLEASE NOTE

## 2016-02-29 ENCOUNTER — Ambulatory Visit: Payer: 59 | Admitting: Infectious Disease

## 2016-03-10 ENCOUNTER — Encounter (HOSPITAL_COMMUNITY): Payer: Self-pay | Admitting: Emergency Medicine

## 2016-03-10 ENCOUNTER — Emergency Department (HOSPITAL_COMMUNITY)
Admission: EM | Admit: 2016-03-10 | Discharge: 2016-03-10 | Disposition: A | Discharge status: Home / Self Care | Payer: 59 | Attending: Emergency Medicine | Admitting: Emergency Medicine

## 2016-03-10 DIAGNOSIS — J45909 Unspecified asthma, uncomplicated: Secondary | ICD-10-CM | POA: Diagnosis present

## 2016-03-10 DIAGNOSIS — J45901 Unspecified asthma with (acute) exacerbation: Secondary | ICD-10-CM

## 2016-03-10 DIAGNOSIS — Z79899 Other long term (current) drug therapy: Secondary | ICD-10-CM | POA: Insufficient documentation

## 2016-03-10 MED ORDER — ALBUTEROL SULFATE (2.5 MG/3ML) 0.083% IN NEBU
5.0000 mg | INHALATION_SOLUTION | Freq: Once | RESPIRATORY_TRACT | Status: AC
  Administered 2016-03-10: 5 mg via RESPIRATORY_TRACT

## 2016-03-10 MED ORDER — ALBUTEROL SULFATE (2.5 MG/3ML) 0.083% IN NEBU
INHALATION_SOLUTION | RESPIRATORY_TRACT | Status: AC
  Filled 2016-03-10: qty 6

## 2016-03-10 NOTE — ED Triage Notes (Signed)
Patient reports asthma attack this morning with wheezing and dry cough , denies fever or chills .

## 2016-03-10 NOTE — ED Provider Notes (Signed)
[facility]EMERGENCY DEPT Provider Note   CSN: [id number] Arrival date & time: 03/10/16  [id number]     History   Chief Complaint Chief Complaint  Patient presents with  . Asthma    HPI David GranaJaylen T Methot is a 20 y.o. male.  The history is provided by the patient and medical records. No language interpreter was used.  Asthma  Pertinent negatives include no abdominal pain, no headaches and no shortness of breath.   David GranaJaylen T Pocius is a 20 y.o. male  with a PMH of asthma who presents to the Emergency Department complaining of 2-3 day history of sneezing, nasal congestion, dry cough. Last night around 4 AM, he noticed tightness in his chest and wheezing. He had called the pharmacy yesterday to refill his inhaler, but the pharmacy was not open today. He was worried about the tightness in his chest and coughing, so he came to the ER for further evaluation. No medications were taken prior to arrival for symptoms. DuoNeb given in triage prior to evaluation inpatient notices complete resolution of chest tightness. No shortness of breath currently. Denies fevers.   Past Medical History:  Diagnosis Date  . Allergy   . Asthma   . Asthma, chronic 08/06/2014  . Chlamydia 06/08/2015  . History of giardia infection   . HIV antibody positive (HCC)   . HIV disease (HCC) 08/06/2014  . Industrial dermatitis   . Loss of weight     Patient Active Problem List   Diagnosis Date Noted  . Low HDL (under 40) 07/07/2015  . Allergic contact dermatitis 11/09/2014  . Chronic constipation 11/09/2014  . History of gonorrhea 11/09/2014  . HIV positive (HCC) 11/09/2014  . History of giardia infection 11/09/2014  . HIV disease (HCC) 08/06/2014  . Asthma, mild intermittent 08/06/2014  . Seasonal allergic rhinitis 08/31/2006    History reviewed. No pertinent surgical history.     Home Medications    Prior to Admission medications   Medication Sig Start Date End Date Taking? Authorizing Provider  albuterol  (PROVENTIL) (2.5 MG/3ML) 0.083% nebulizer solution Take 3 mLs (2.5 mg total) by nebulization every 6 (six) hours as needed for wheezing or shortness of breath. 11/24/15   Kristen N Mikeala Girdler, DO  albuterol (VENTOLIN HFA) 108 (90 Base) MCG/ACT inhaler INHALE 2 PUFFS AS NEEDED FOR ASTHMA/WHEEZING 02/18/16   Alba CoryKrichna Sowles, MD  cetirizine (ZYRTEC) 10 MG tablet TAKE 1 TABLET (10 MG TOTAL) BY MOUTH DAILY. 05/07/15   Alba CoryKrichna Sowles, MD  fluticasone furoate-vilanterol (BREO ELLIPTA) 100-25 MCG/INH AEPB Inhale 1 puff into the lungs daily. 02/18/16   Alba CoryKrichna Sowles, MD  montelukast (SINGULAIR) 10 MG tablet TAKE 1 TABLET EVERY DAY 01/07/15   Alba CoryKrichna Sowles, MD  TRIUMEQ 600-50-300 MG tablet TAKE 1 TABLET BY MOUTH DAILY. (PLEASE SCHEDULE FOLLOW UP APPT [phone]) 01/25/16   Randall Hissornelius N [place], MD    Family History Family History  Problem Relation Age of Onset  . Asthma Mother   . Allergic rhinitis Mother   . Asthma Brother     Younger Brother    Social History Social History  Substance Use Topics  . Smoking status: Never Smoker  . Smokeless tobacco: Never Used  . Alcohol use 0.0 oz/week     Comment: very seldom, but binges sometimes     Allergies   Patient has no known allergies.   Review of Systems Review of Systems  Constitutional: Negative for chills and fever.  HENT: Positive for congestion and sneezing.   Eyes: Negative for  visual disturbance.  Respiratory: Positive for cough, chest tightness and wheezing. Negative for shortness of breath.   Cardiovascular: Negative.   Gastrointestinal: Negative for abdominal pain, nausea and vomiting.  Genitourinary: Negative for dysuria.  Musculoskeletal: Negative for back pain and neck pain.  Skin: Negative for rash.  Neurological: Negative for headaches.     Physical Exam Updated Vital Signs BP 136/87 (BP Location: Left Arm)   Pulse 93   Temp 97.5 F (36.4 C) (Oral)   Resp 20   SpO2 97%   Physical Exam  Constitutional: He is oriented  to person, place, and time. He appears well-developed and well-nourished. No distress.  HENT:  Head: Normocephalic and atraumatic.  Cardiovascular: Normal rate, regular rhythm and normal heart sounds.   No murmur heard. Pulmonary/Chest: Effort normal. No respiratory distress.  Lungs clear to auscultation bilaterally with no wheezes, rales, rhonchi. Speaking in full sentences without difficulty. 97% O2 on room air.  Abdominal: Soft. He exhibits no distension. There is no tenderness.  Musculoskeletal: Normal range of motion.  Neurological: He is alert and oriented to person, place, and time.  Skin: Skin is warm and dry.  Nursing note and vitals reviewed.    ED Treatments / Results  Labs (all labs ordered are listed, but only abnormal results are displayed) Labs Reviewed - No data to display  EKG  EKG Interpretation None       Radiology No results found.  Procedures Procedures (including critical care time)  Medications Ordered in ED Medications  albuterol (PROVENTIL) (2.5 MG/3ML) 0.083% nebulizer solution 5 mg (5 mg Nebulization Given 03/10/16 0548)     Initial Impression / Assessment and Plan / ED Course  I have reviewed the triage vital signs and the nursing notes.  Pertinent labs & imaging results that were available during my care of the patient were reviewed by me and considered in my medical decision making (see chart for details).  Clinical Course    David Mendez is a 20 y.o. male who presents to ED for wheezing and chest tightness c/w typical asthma flare. He states that if he would have had an inhaler at home this morning, he probably would not have needed to come in, but the pharmacy was closed. He has an rx and is just waiting for pharmacy to open so that he can get prescription. On exam, patient is afebrile with a clear lung exam.  Duoneb was given in triage prior to arrival and patient endorses complete resolution of symptoms. Patient states that he feels  improved and is ready for discharge home. Discussed possible chest x-ray for further evaluation of symptoms, but patient declines at this time. PCP follow-up strongly encouraged for discussion of today's diagnosis. Reasons to return to the ER were discussed and all questions answered.   Final Clinical Impressions(s) / ED Diagnoses   Final diagnoses:  Mild asthma with exacerbation, unspecified whether persistent    New Prescriptions New Prescriptions   No medications on file     [facility] Hospital[facility]ime Pilcher Esteven Overfelt, PA-C 03/10/16 [id number]    Gwyneth SproutWhitney Plunkett, MD 03/10/16 [phone]

## 2016-03-10 NOTE — Discharge Instructions (Signed)
Please follow-up with your primary care physician for discussion of today's diagnosis. Please keep your inhaler filled and use as needed for wheezing, shortness of breath. Return to ER for fever, difficulty breathing, new or worsening symptoms, any additional concerns.

## 2016-04-25 ENCOUNTER — Other Ambulatory Visit: Payer: Self-pay | Admitting: Infectious Disease

## 2016-04-25 DIAGNOSIS — B20 Human immunodeficiency virus [HIV] disease: Secondary | ICD-10-CM

## 2016-05-20 ENCOUNTER — Ambulatory Visit: Payer: 59 | Admitting: Family Medicine

## 2016-05-28 ENCOUNTER — Encounter (HOSPITAL_COMMUNITY): Payer: Self-pay | Admitting: Emergency Medicine

## 2016-05-28 ENCOUNTER — Emergency Department (HOSPITAL_COMMUNITY)
Admission: EM | Admit: 2016-05-28 | Discharge: 2016-05-28 | Disposition: A | Discharge status: Home / Self Care | Payer: 59 | Attending: Emergency Medicine | Admitting: Emergency Medicine

## 2016-05-28 DIAGNOSIS — Y929 Unspecified place or not applicable: Secondary | ICD-10-CM | POA: Diagnosis not present

## 2016-05-28 DIAGNOSIS — Y999 Unspecified external cause status: Secondary | ICD-10-CM | POA: Diagnosis not present

## 2016-05-28 DIAGNOSIS — S39012A Strain of muscle, fascia and tendon of lower back, initial encounter: Secondary | ICD-10-CM | POA: Diagnosis not present

## 2016-05-28 DIAGNOSIS — S3992XA Unspecified injury of lower back, initial encounter: Secondary | ICD-10-CM | POA: Diagnosis present

## 2016-05-28 DIAGNOSIS — Y939 Activity, unspecified: Secondary | ICD-10-CM | POA: Insufficient documentation

## 2016-05-28 DIAGNOSIS — X501XXA Overexertion from prolonged static or awkward postures, initial encounter: Secondary | ICD-10-CM | POA: Insufficient documentation

## 2016-05-28 DIAGNOSIS — J45909 Unspecified asthma, uncomplicated: Secondary | ICD-10-CM | POA: Diagnosis not present

## 2016-05-28 MED ORDER — CYCLOBENZAPRINE HCL 10 MG PO TABS: 5.0000 mg | ORAL_TABLET | Freq: Two times a day (BID) | ORAL | 0 refills | Status: DC | PRN

## 2016-05-28 MED ORDER — IBUPROFEN 800 MG PO TABS: 800.0000 mg | ORAL_TABLET | Freq: Three times a day (TID) | ORAL | 0 refills | Status: DC

## 2016-05-28 MED ORDER — KETOROLAC TROMETHAMINE 60 MG/2ML IM SOLN
60.0000 mg | Freq: Once | INTRAMUSCULAR | Status: AC
  Administered 2016-05-28: 60 mg via INTRAMUSCULAR
  Filled 2016-05-28: qty 2

## 2016-05-28 NOTE — ED Provider Notes (Signed)
[facility] Provider Note   CSN: [id number] Arrival date & time: 05/28/16  0604     History   Chief Complaint Chief Complaint  Patient presents with  . Back Pain    HPI David Mendez is a 21 y.o. male.  The history is provided by the patient. No language interpreter was used.  Back Pain      David Mendez is a 21 y.o. male who presents to the Emergency Department complaining of back pain.  He reports 5 days of left lower back pain that started after reaching and twisting for an item. Pain is located in the left lower back and occasionally radiates to the right low back. He has no associated fevers, nausea, vomiting, abdominal pain, dysuria, hematuria, numbness, weakness, abdominal pain. Pain is worse with movement and walking. No prior history of back pain or back injury. Symptoms are moderate, constant, worsening.  Past Medical History:  Diagnosis Date  . Allergy   . Asthma   . Asthma, chronic 08/06/2014  . Chlamydia 06/08/2015  . History of giardia infection   . HIV antibody positive (HCC)   . HIV disease (HCC) 08/06/2014  . Industrial dermatitis   . Loss of weight     Patient Active Problem List   Diagnosis Date Noted  . Low HDL (under 40) 07/07/2015  . Allergic contact dermatitis 11/09/2014  . Chronic constipation 11/09/2014  . History of gonorrhea 11/09/2014  . HIV positive (HCC) 11/09/2014  . History of giardia infection 11/09/2014  . HIV disease (HCC) 08/06/2014  . Asthma, mild intermittent 08/06/2014  . Seasonal allergic rhinitis 08/31/2006    History reviewed. No pertinent surgical history.     Home Medications    Prior to Admission medications   Medication Sig Start Date End Date Taking? Authorizing Provider  albuterol (PROVENTIL) (2.5 MG/3ML) 0.083% nebulizer solution Take 3 mLs (2.5 mg total) by nebulization every 6 (six) hours as needed for wheezing or shortness of breath. 11/24/15   Kristen N Ward, DO  albuterol (VENTOLIN HFA) 108 (90  Base) MCG/ACT inhaler INHALE 2 PUFFS AS NEEDED FOR ASTHMA/WHEEZING 02/18/16   Alba Cory, MD  cetirizine (ZYRTEC) 10 MG tablet TAKE 1 TABLET (10 MG TOTAL) BY MOUTH DAILY. 05/07/15   Alba Cory, MD  fluticasone furoate-vilanterol (BREO ELLIPTA) 100-25 MCG/INH AEPB Inhale 1 puff into the lungs daily. 02/18/16   Alba Cory, MD  montelukast (SINGULAIR) 10 MG tablet TAKE 1 TABLET EVERY DAY 01/07/15   Alba Cory, MD  TRIUMEQ 600-50-300 MG tablet TAKE 1 TABLET BY MOUTH DAILY. (PLEASE SCHEDULE FOLLOW UP APPT [phone]) 04/25/16   Randall Hiss, MD    Family History Family History  Problem Relation Age of Onset  . Asthma Mother   . Allergic rhinitis Mother   . Asthma Brother     Younger Brother    Social History Social History  Substance Use Topics  . Smoking status: Never Smoker  . Smokeless tobacco: Never Used  . Alcohol use 0.0 oz/week     Comment: very seldom, but binges sometimes     Allergies   Patient has no known allergies.   Review of Systems Review of Systems  Musculoskeletal: Positive for back pain.  All other systems reviewed and are negative.    Physical Exam Updated Vital Signs BP 124/87 (BP Location: Left Arm)   Pulse 82   Temp 97.5 F (36.4 C) (Oral)   Resp 22   SpO2 99%   Physical Exam  Constitutional:  He is oriented to person, place, and time. He appears well-developed and well-nourished.  HENT:  Head: Normocephalic and atraumatic.  Cardiovascular: Normal rate and regular rhythm.   No murmur heard. Pulmonary/Chest: Effort normal and breath sounds normal. No respiratory distress.  Abdominal: Soft. There is no tenderness. There is no rebound and no guarding.  Musculoskeletal: He exhibits no edema or tenderness.  2+ DP pulses bilaterally. There is tenderness to palpation over the left lower back. No midline lumbar tenderness to palpation. Pain in the low back is reproducible with lifting the left lower extremity.  Neurological: He is  alert and oriented to person, place, and time.  5 out of 5 strength in all 4 extremities with sensation to light touch intact in all 4 extremities.  Skin: Skin is warm and dry.  Psychiatric: He has a normal mood and affect. His behavior is normal.  Nursing note and vitals reviewed.    ED Treatments / Results  Labs (all labs ordered are listed, but only abnormal results are displayed) Labs Reviewed - No data to display  EKG  EKG Interpretation None       Radiology No results found.  Procedures Procedures (including critical care time)  Medications Ordered in ED Medications  ketorolac (TORADOL) injection 60 mg (not administered)     Initial Impression / Assessment and Plan / ED Course  I have reviewed the triage vital signs and the nursing notes.  Pertinent labs & imaging results that were available during my care of the patient were reviewed by me and considered in my medical decision making (see chart for details).     Patient with history of HIV, currently on medications here for evaluation of low back pain after a twisting injury. He is neurovascularly intact on examination with reproducible left low back pain. Current clinical picture is consistent with acute lumbar strain. Presentation is not consistent with epidural abscess, psoas abscess, cauda equina. D/w patient home care for low back strain with NSAIDs, muscle relaxants, rest, heat. Outpatient follow-up and return precautions discussed.  Final Clinical Impressions(s) / ED Diagnoses   Final diagnoses:  Strain of lumbar region, initial encounter    New Prescriptions New Prescriptions   No medications on file     Tilden FossaElizabeth Tranise Forrest, MD 05/28/16 [phone]

## 2016-05-28 NOTE — ED Triage Notes (Signed)
Patient states that he has been having some back pain since Monday or Tuesday.  He states that he may have over reached and pulled something one of those night.  He was taking Tylenol which did help at first, but he pain is getting worse today.

## 2016-05-28 NOTE — ED Notes (Signed)
Declined W/C at D/C and was escorted to lobby by RN. 

## 2016-05-29 ENCOUNTER — Other Ambulatory Visit: Payer: Self-pay | Admitting: Infectious Disease

## 2016-05-29 DIAGNOSIS — B20 Human immunodeficiency virus [HIV] disease: Secondary | ICD-10-CM

## 2016-06-03 ENCOUNTER — Other Ambulatory Visit: Payer: Self-pay | Admitting: Family Medicine

## 2016-06-03 NOTE — Telephone Encounter (Signed)
Patient requesting refill of Montelukast to [facility].  

## 2016-07-04 ENCOUNTER — Ambulatory Visit: Payer: 59 | Admitting: Infectious Disease

## 2016-08-12 ENCOUNTER — Other Ambulatory Visit: Payer: Self-pay | Admitting: Family Medicine

## 2016-08-12 DIAGNOSIS — J4540 Moderate persistent asthma, uncomplicated: Secondary | ICD-10-CM

## 2016-08-14 MED ORDER — ALBUTEROL SULFATE HFA 108 (90 BASE) MCG/ACT IN AERS: 2.0000 | INHALATION_SPRAY | RESPIRATORY_TRACT | 0 refills | Status: DC | PRN

## 2016-09-13 ENCOUNTER — Emergency Department (HOSPITAL_COMMUNITY): Payer: 59

## 2016-09-13 ENCOUNTER — Emergency Department (HOSPITAL_COMMUNITY)
Admission: EM | Admit: 2016-09-13 | Discharge: 2016-09-13 | Disposition: A | Discharge status: Home / Self Care | Payer: 59 | Attending: Emergency Medicine | Admitting: Emergency Medicine

## 2016-09-13 ENCOUNTER — Encounter (HOSPITAL_COMMUNITY): Payer: Self-pay

## 2016-09-13 DIAGNOSIS — J45909 Unspecified asthma, uncomplicated: Secondary | ICD-10-CM | POA: Diagnosis not present

## 2016-09-13 DIAGNOSIS — Z79899 Other long term (current) drug therapy: Secondary | ICD-10-CM | POA: Insufficient documentation

## 2016-09-13 DIAGNOSIS — M25512 Pain in left shoulder: Secondary | ICD-10-CM

## 2016-09-13 MED ORDER — CYCLOBENZAPRINE HCL 5 MG PO TABS: 5.0000 mg | ORAL_TABLET | Freq: Three times a day (TID) | ORAL | 0 refills | Status: DC | PRN

## 2016-09-13 MED ORDER — NAPROXEN 250 MG PO TABS
500.0000 mg | ORAL_TABLET | Freq: Once | ORAL | Status: AC
  Administered 2016-09-13: 500 mg via ORAL
  Filled 2016-09-13: qty 2

## 2016-09-13 MED ORDER — NAPROXEN 500 MG PO TABS: 500.0000 mg | ORAL_TABLET | Freq: Two times a day (BID) | ORAL | 0 refills | Status: DC

## 2016-09-13 NOTE — Discharge Instructions (Signed)
You were seen today for pain in your left shoulder. Your x-rays are negative. This is likely musculoskeletal. Apply heat or ice and use anti-inflammatory medications. He will be given a short course of muscle relaxants. Do not drive or operate heavy machinery under the influence of muscle relaxants.

## 2016-09-13 NOTE — ED Triage Notes (Signed)
Pt states that L shoulder pain has been going on for several months, worse in the last week. Denies injury, full ROM

## 2016-09-13 NOTE — ED Provider Notes (Signed)
[facility]ER[facility]ENCY DEPT Provider Note   CSN: [id number] Arrival date & time: 09/13/16  4098     History   Chief Complaint Chief Complaint  Patient presents with  . Shoulder Pain    HPI David Mendez is a 21 y.o. male.  HPI  This is a 21 year old male with no significant past medical history who presents with left shoulder pain. Patient reports he had similar pain several months ago. However over the last week he has had worsening left shoulder pain. Worse with movement and shrugging of shoulders. Current pain is 8 out of 10. He has not taken anything for the pain. He reports no known injury. Denies weakness, numbness, tingling in the left arm.  Past Medical History:  Diagnosis Date  . Allergy   . Asthma   . Asthma, chronic 08/06/2014  . Chlamydia 06/08/2015  . History of giardia infection   . HIV antibody positive (HCC)   . HIV disease (HCC) 08/06/2014  . Industrial dermatitis   . Loss of weight     Patient Active Problem List   Diagnosis Date Noted  . Low HDL (under 40) 07/07/2015  . Allergic contact dermatitis 11/09/2014  . Chronic constipation 11/09/2014  . History of gonorrhea 11/09/2014  . HIV positive (HCC) 11/09/2014  . History of giardia infection 11/09/2014  . HIV disease (HCC) 08/06/2014  . Asthma, mild intermittent 08/06/2014  . Seasonal allergic rhinitis 08/31/2006    History reviewed. No pertinent surgical history.     Home Medications    Prior to Admission medications   Medication Sig Start Date End Date Taking? Authorizing Provider  albuterol (PROVENTIL) (2.5 MG/3ML) 0.083% nebulizer solution Take 3 mLs (2.5 mg total) by nebulization every 6 (six) hours as needed for wheezing or shortness of breath. 11/24/15   Ward, Layla Maw, DO  albuterol (VENTOLIN HFA) 108 (90 Base) MCG/ACT inhaler Inhale 2 puffs into the lungs every 4 (four) hours as needed for wheezing or shortness of breath. 08/14/16   Lada, Janit Bern, MD  cetirizine (ZYRTEC) 10 MG tablet TAKE 1  TABLET (10 MG TOTAL) BY MOUTH DAILY. 05/07/15   Alba Cory, MD  cyclobenzaprine (FLEXERIL) 5 MG tablet Take 1 tablet (5 mg total) by mouth 3 (three) times daily as needed for muscle spasms. 09/13/16   Alexah Kivett, Mayer Masker, MD  fluticasone furoate-vilanterol (BREO ELLIPTA) 100-25 MCG/INH AEPB Inhale 1 puff into the lungs daily. 02/18/16   Alba Cory, MD  ibuprofen (ADVIL,MOTRIN) 800 MG tablet Take 1 tablet (800 mg total) by mouth 3 (three) times daily. 05/28/16   Tilden Fossa, MD  montelukast (SINGULAIR) 10 MG tablet TAKE 1 TABLET EVERY DAY 06/03/16   Alba Cory, MD  naproxen (NAPROSYN) 500 MG tablet Take 1 tablet (500 mg total) by mouth 2 (two) times daily. 09/13/16   Kanesha Cadle, Mayer Masker, MD  TRIUMEQ 600-50-300 MG tablet TAKE 1 TABLET BY MOUTH DAILY. (PLEASE SCHEDULE FOLLOW UP APPT [phone]) 05/30/16   Daiva Eves, Lisette Grinder, MD    Family History Family History  Problem Relation Age of Onset  . Asthma Mother   . Allergic rhinitis Mother   . Asthma Brother        Younger Brother    Social History Social History  Substance Use Topics  . Smoking status: Never Smoker  . Smokeless tobacco: Never Used  . Alcohol use 0.0 oz/week     Comment: very seldom, but binges sometimes     Allergies   Patient has no known allergies.  Review of Systems Review of Systems  Constitutional: Negative for fever.  Respiratory: Negative for shortness of breath.   Cardiovascular: Negative for chest pain.  Musculoskeletal: Positive for arthralgias. Negative for back pain and joint swelling.       Shoulder pain  Skin: Negative for color change.  All other systems reviewed and are negative.    Physical Exam Updated Vital Signs BP 122/88 (BP Location: Left Arm)   Pulse 71   Temp 98.3 F (36.8 C) (Oral)   Resp 17   Ht 5\' 6"  (1.676 m)   Wt 77.6 kg (171 lb)   SpO2 97%   BMI 27.60 kg/m   Physical Exam  Constitutional: He is oriented to person, place, and time. He appears  well-developed and well-nourished.  HENT:  Head: Normocephalic and atraumatic.  Cardiovascular: Normal rate, regular rhythm and normal heart sounds.   Pulmonary/Chest: Effort normal and breath sounds normal. No respiratory distress. He has no wheezes.  Musculoskeletal:  Normal range of motion of the left shoulder, no tenderness to palpation along the clavicle or before meals joint, there is some tenderness over the left trapezius, no overlying skin changes, 2+ radial pulse, normal strength  Neurological: He is alert and oriented to person, place, and time.  Skin: Skin is warm and dry.  Psychiatric: He has a normal mood and affect.  Nursing note and vitals reviewed.    ED Treatments / Results  Labs (all labs ordered are listed, but only abnormal results are displayed) Labs Reviewed - No data to display  EKG  EKG Interpretation None       Radiology Dg Shoulder Left  Result Date: 09/13/2016 CLINICAL DATA:  Nontraumatic posterior left shoulder pain for 2 months. EXAM: LEFT SHOULDER - 2+ VIEW COMPARISON:  None. FINDINGS: There is no evidence of fracture or dislocation. There is no evidence of arthropathy or other focal bone abnormality. Soft tissues are unremarkable. IMPRESSION: Negative. Electronically Signed   By: Ellery Plunkaniel R Mitchell M.D.   On: 09/13/2016 05:55    Procedures Procedures (including critical care time)  Medications Ordered in ED Medications  naproxen (NAPROSYN) tablet 500 mg (not administered)     Initial Impression / Assessment and Plan / ED Course  I have reviewed the triage vital signs and the nursing notes.  Pertinent labs & imaging results that were available during my care of the patient were reviewed by me and considered in my medical decision making (see chart for details).     Patient presents with acute on chronic left shoulder pain. Denies injury. It is over the musculature of the shoulder. Otherwise exam is reassuring. X-rays are negative. Suspect  musculoskeletal pain.  Recommend rest, ice or heat as tolerated, naproxen, muscle relaxant. Follow-up with primary physician.  After history, exam, and medical workup I feel the patient has been appropriately medically screened and is safe for discharge home. Pertinent diagnoses were discussed with the patient. Patient was given return precautions.   Final Clinical Impressions(s) / ED Diagnoses   Final diagnoses:  Acute pain of left shoulder    New Prescriptions New Prescriptions   CYCLOBENZAPRINE (FLEXERIL) 5 MG TABLET    Take 1 tablet (5 mg total) by mouth 3 (three) times daily as needed for muscle spasms.   NAPROXEN (NAPROSYN) 500 MG TABLET    Take 1 tablet (500 mg total) by mouth 2 (two) times daily.     Shon BatonHorton, Brittanyann Wittner F, MD 09/13/16 [phone]

## 2016-09-20 ENCOUNTER — Other Ambulatory Visit (HOSPITAL_COMMUNITY)
Admission: RE | Admit: 2016-09-20 | Discharge: 2016-09-20 | Disposition: A | Discharge status: Home / Self Care | Payer: 59 | Source: Ambulatory Visit | Attending: Infectious Disease | Admitting: Infectious Disease

## 2016-09-20 ENCOUNTER — Ambulatory Visit (INDEPENDENT_AMBULATORY_CARE_PROVIDER_SITE_OTHER): Payer: 59 | Admitting: Pharmacist Clinician (PhC)/ Clinical Pharmacy Specialist

## 2016-09-20 DIAGNOSIS — B20 Human immunodeficiency virus [HIV] disease: Secondary | ICD-10-CM | POA: Diagnosis not present

## 2016-09-20 DIAGNOSIS — Z23 Encounter for immunization: Secondary | ICD-10-CM | POA: Diagnosis not present

## 2016-09-20 LAB — CBC WITH DIFFERENTIAL/PLATELET
BASOS PCT: 0 %
Basophils Absolute: 0 cells/uL (ref 0–200)
Eosinophils Absolute: 370 cells/uL (ref 15–500)
Eosinophils Relative: 5 %
HCT: 44.4 % (ref 38.5–50.0)
Hemoglobin: 15.6 g/dL (ref 13.2–17.1)
Lymphocytes Relative: 61 %
Lymphs Abs: 4514 cells/uL — ABNORMAL HIGH (ref 850–3900)
MCH: 31.5 pg (ref 27.0–33.0)
MCHC: 35.1 g/dL (ref 32.0–36.0)
MCV: 89.7 fL (ref 80.0–100.0)
MONOS PCT: 5 %
MPV: 9.6 fL (ref 7.5–12.5)
Monocytes Absolute: 370 cells/uL (ref 200–950)
NEUTROS ABS: 2146 {cells}/uL (ref 1500–7800)
Neutrophils Relative %: 29 %
PLATELETS: 260 10*3/uL (ref 140–400)
RBC: 4.95 MIL/uL (ref 4.20–5.80)
RDW: 13.5 % (ref 11.0–15.0)
WBC: 7.4 10*3/uL (ref 3.8–10.8)

## 2016-09-20 MED ORDER — ABACAVIR-DOLUTEGRAVIR-LAMIVUD 600-50-300 MG PO TABS: ORAL_TABLET | ORAL | 2 refills | Status: DC

## 2016-09-20 NOTE — Progress Notes (Signed)
HPI: David Mendez is a 21 y.o. male who is here to f/u with pharmacy because he has not follow up with us in over a year.   Allergies: No Known Allergies  Vitals:    Past Medical History: Past Medical History:  Diagnosis Date  . Allergy   . Asthma   . Asthma, chronic 08/06/2014  . Chlamydia 06/08/2015  . History of giardia infection   . HIV antibody positive (HCC)   . HIV disease (HCC) 08/06/2014  . Industrial dermatitis   . Loss of weight     Social History: Social History   Social History  . Marital status: Single    Spouse name: N/A  . Number of children: N/A  . Years of education: N/A   Social History Main Topics  . Smoking status: Never Smoker  . Smokeless tobacco: Never Used  . Alcohol use 0.0 oz/week     Comment: very seldom, but binges sometimes  . Drug use: No  . Sexual activity: Yes    Partners: Male    Birth control/ protection: Condom   Other Topics Concern  . Not on file   Social History Narrative  . No narrative on file    Previous Regimen: None  Current Regimen: Triumeq  Labs: HIV 1 RNA Quant (copies/mL)  Date Value  05/25/2015 32 (H)  11/12/2014 92 (H)  09/10/2014 203 (H)   CD4 T Cell Abs (/uL)  Date Value  05/25/2015 900  11/12/2014 910  09/10/2014 840   Hep B S Ab (no units)  Date Value  07/22/2014 POS (A)   Hepatitis B Surface Ag (no units)  Date Value  07/22/2014 NEGATIVE   HCV Ab (no units)  Date Value  07/22/2014 NEGATIVE    CrCl: CrCl cannot be calculated (Patient's most recent lab result is older than the maximum 21 days allowed.).  Lipids:    Component Value Date/Time   CHOL 169 05/25/2015 0841   TRIG 134 05/25/2015 0841   HDL 34 (L) 05/25/2015 0841   CHOLHDL 5.0 05/25/2015 0841   VLDL 27 05/25/2015 0841   LDLCALC 108 05/25/2015 0841    Assessment: David Mendez hasn't follow up with us in over a year because of lack of transportation due to car trouble and various other issue. He has a reliable  transportation now. He [place] near [facility] [address]. He currently works for a [employer] company called [employer] (3rd shift). He is still on his dad insurance. He stated that he has only missed 1-2 doses in the past couple of months.  He hasn't had any sexual encounters in the past 6 months. He was positive for chlamydia and gonnorrhea in the past so we are going to get all swabs today.   Told him repeatedly that he must make the appts. He has not has any gaps in his therapy so far. His hep A and B are good. We will start his Menveo series today. Scheduled him to come back and see Dr. Daiva EvesVan Dam in Sept and we can give the second Menveo at that time.   Recommendations:  Continue Triumeq All HIV/STDs labs today Menveo #1 today F/u with Dr. Daiva EvesVan Dam in Sept and get Menveo #2  [name], PharmD, BCPS, AAHIVP, CPP Clinical Infectious Disease Pharmacist [facility] 09/20/2016, 2:55 PM

## 2016-09-21 LAB — COMPLETE METABOLIC PANEL WITH GFR
ALT: 34 U/L (ref 9–46)
AST: 18 U/L (ref 10–40)
Albumin: 4.6 g/dL (ref 3.6–5.1)
Alkaline Phosphatase: 74 U/L (ref 40–115)
BILIRUBIN TOTAL: 0.7 mg/dL (ref 0.2–1.2)
BUN: 19 mg/dL (ref 7–25)
CHLORIDE: 106 mmol/L (ref 98–110)
CO2: 25 mmol/L (ref 20–31)
CREATININE: 1.06 mg/dL (ref 0.60–1.35)
Calcium: 9.5 mg/dL (ref 8.6–10.3)
GFR, Est African American: >89 mL/min (ref >=60)
Glucose, Bld: 90 mg/dL (ref 65–99)
Potassium: 4.1 mmol/L (ref 3.5–5.3)
SODIUM: 141 mmol/L (ref 135–146)
TOTAL PROTEIN: 7.1 g/dL (ref 6.1–8.1)

## 2016-09-22 LAB — CYTOLOGY, (ORAL, ANAL, URETHRAL) ANCILLARY ONLY
CHLAMYDIA, DNA PROBE: NEGATIVE
Chlamydia: POSITIVE — AB
NEISSERIA GONORRHEA: NEGATIVE
NEISSERIA GONORRHEA: NEGATIVE

## 2016-09-22 LAB — T-HELPER CELL (CD4) - (RCID CLINIC ONLY)
CD4 % Helper T Cell: 37 % (ref 33–55)
CD4 T Cell Abs: 1810 /uL (ref 400–2700)

## 2016-09-22 LAB — URINE CYTOLOGY ANCILLARY ONLY
Chlamydia: NEGATIVE
NEISSERIA GONORRHEA: NEGATIVE

## 2016-09-23 LAB — RPR

## 2016-09-26 ENCOUNTER — Telehealth: Payer: Self-pay | Admitting: *Deleted

## 2016-09-26 NOTE — Telephone Encounter (Signed)
-----   Message from Randall Hissornelius N Van Dam, MD sent at 09/22/2016  4:33 PM EDT ----- Pt needs 1 gram azithromycin x 1 vs doxy 100 mg bid x 7 days partners need to be tested and treated

## 2016-09-26 NOTE — Telephone Encounter (Signed)
Called patient and left voice mail for him to return the call as soon as possible for lab results. Please see Dr. Clinton GallantVan Dam's note below. David MolaJacqueline Edris Mendez

## 2016-09-26 NOTE — Telephone Encounter (Signed)
Thanks Jackie 

## 2016-09-27 ENCOUNTER — Other Ambulatory Visit: Payer: Self-pay | Admitting: *Deleted

## 2016-09-27 DIAGNOSIS — A549 Gonococcal infection, unspecified: Secondary | ICD-10-CM

## 2016-09-27 LAB — HIV RNA, RTPCR W/R GT (RTI, PI,INT)
HIV-1 RNA, QN PCR: DETECTED {copies}/mL
HIV-1 RNA, QN PCR: <1.3 Log copies/mL

## 2016-09-27 MED ORDER — AZITHROMYCIN 250 MG PO TABS: 1000.0000 mg | ORAL_TABLET | Freq: Once | ORAL | 0 refills | Status: AC

## 2016-09-27 NOTE — Telephone Encounter (Signed)
Patient returned call, he would prefer 1 gm azithromycin by mouth once. RN sent in prescription to his pharmacy of choice. Andree CossHowell, Bryony Kaman M, RN

## 2016-10-12 ENCOUNTER — Other Ambulatory Visit: Payer: Self-pay | Admitting: Infectious Disease

## 2016-10-12 DIAGNOSIS — B20 Human immunodeficiency virus [HIV] disease: Secondary | ICD-10-CM

## 2016-11-30 ENCOUNTER — Ambulatory Visit: Payer: 59 | Admitting: Infectious Disease

## 2016-12-07 ENCOUNTER — Ambulatory Visit: Payer: Self-pay | Admitting: Infectious Disease

## 2016-12-07 ENCOUNTER — Telehealth: Payer: Self-pay | Admitting: Family Medicine

## 2016-12-07 DIAGNOSIS — J4540 Moderate persistent asthma, uncomplicated: Secondary | ICD-10-CM

## 2016-12-07 NOTE — Telephone Encounter (Signed)
Pt requesting refill on ventolin. Please send to [facility]

## 2016-12-08 NOTE — Telephone Encounter (Signed)
Pt scheduled appt for tomorrow.

## 2016-12-08 NOTE — Telephone Encounter (Signed)
I have not seen him since last year, must be seen for refills.

## 2016-12-09 ENCOUNTER — Encounter: Payer: Self-pay | Admitting: Family Medicine

## 2016-12-09 ENCOUNTER — Ambulatory Visit (INDEPENDENT_AMBULATORY_CARE_PROVIDER_SITE_OTHER): Payer: 59 | Admitting: Family Medicine

## 2016-12-09 VITALS — BP 130/76 | HR 98 | Temp 98.3°F | Resp 16 | Ht 66.0 in | Wt 183.7 lb

## 2016-12-09 DIAGNOSIS — Z23 Encounter for immunization: Secondary | ICD-10-CM

## 2016-12-09 DIAGNOSIS — Z131 Encounter for screening for diabetes mellitus: Secondary | ICD-10-CM | POA: Diagnosis not present

## 2016-12-09 DIAGNOSIS — J4520 Mild intermittent asthma, uncomplicated: Secondary | ICD-10-CM

## 2016-12-09 DIAGNOSIS — E786 Lipoprotein deficiency: Secondary | ICD-10-CM | POA: Diagnosis not present

## 2016-12-09 DIAGNOSIS — B20 Human immunodeficiency virus [HIV] disease: Secondary | ICD-10-CM | POA: Diagnosis not present

## 2016-12-09 DIAGNOSIS — J4521 Mild intermittent asthma with (acute) exacerbation: Secondary | ICD-10-CM

## 2016-12-09 MED ORDER — ALBUTEROL SULFATE HFA 108 (90 BASE) MCG/ACT IN AERS: 2.0000 | INHALATION_SPRAY | RESPIRATORY_TRACT | 0 refills | Status: DC | PRN

## 2016-12-09 MED ORDER — ALBUTEROL SULFATE (2.5 MG/3ML) 0.083% IN NEBU
2.5000 mg | INHALATION_SOLUTION | Freq: Once | RESPIRATORY_TRACT | Status: AC
  Administered 2016-12-09: 2.5 mg via RESPIRATORY_TRACT

## 2016-12-09 MED ORDER — FLUTICASONE FUROATE-VILANTEROL 100-25 MCG/INH IN AEPB: 1.0000 | INHALATION_SPRAY | Freq: Every day | RESPIRATORY_TRACT | 2 refills | Status: DC

## 2016-12-09 NOTE — Patient Instructions (Signed)
Jaw Range of Motion Exercises Jaw range of motion exercises are exercises that help your jaw to move better. These exercises can help to prevent:  Difficulty opening your mouth.  Pain in your jaw while it is both open and closed.  What should I be careful of when doing jaw exercises? Make sure that you only do jaw exercises as directed by your health care provider. You should only move your jaw as far as it can go in each direction, if told to do so by your health care provider. Do not move your jaw into positions that cause you any pain. What exercises should I do?  Stick your jaw forward. Hold this position for 1-2 seconds. Allow your jaw to return to its normal position and rest it there for 1-2 seconds. Do this exercise 8 times.  Stand or sit in front of a mirror. Place your tongue on the roof of your mouth, just behind your top teeth. Slowly open and close your jaw, keeping your tongue on the roof of your mouth. While you open and close your mouth, try to keep your jaw from moving toward one side or the other. Repeat this 8 times.  Move your jaw right. Hold this position for 1-2 seconds. Allow your jaw to return to its normal position, and rest it there for 1-2 seconds. Do this exercise 8 times.  Move your jaw left. Hold this position for 1-2 seconds. Allow your jaw to return to its normal position, and rest it there for 1-2 seconds. Do this exercise 8 times.  Open your mouth as far as it is can comfortably go. Hold this position for 1-2 seconds. Then close your mouth and rest for 1-2 seconds. Do this exercise 8 times.  Move your jaw in a circular motion, starting toward the right (clockwise). Repeat this 8 times.  Move your jaw in a circular motion, starting toward the left (counterclockwise). Repeat this 8 times. Apply moist heat packs or ice packs to your jaw before or after performing your exercises as directed by your health care provider. What else can I do? Avoid the following,  if they cause jaw pain or they increase your jaw pain:  Chewing gum.  Clenching your jaw or teeth or keeping tension in your jaw muscles.  Leaning on your jaw, such as resting your jaw in your hand while leaning on a desk.  This information is not intended to replace advice given to you by your health care provider. Make sure you discuss any questions you have with your health care provider. Document Released: 02/18/2008 Document Revised: 08/13/2015 Document Reviewed: 02/05/2014 Elsevier Interactive Patient Education  2018 Elsevier Inc.  

## 2016-12-09 NOTE — Progress Notes (Signed)
Name: David Mendez   MRN: [id number]    DOB: 01/16/1996   Date:12/09/2016       Progress Note  Subjective  Chief Complaint  Chief Complaint  Patient presents with  . Medication Refill  . Asthma    SOB, coughing, chest tightness  . Allergic Rhinitis     sneezing  . Jaw Pain    Onset-Since taking wisdom teeth out-months ago. Patient jaw will pop and it is auditory where friends can hear it near him.     HPI  Asthma: usually mild intermittent, however over the past couple of weeks he has noticed wheezing, wet cough, and SOB , he has been using inhaler almost daily, he has been waking up during the night because of cough. No rhinorrhea or nasal congestion. No fever or chills. Appetite is normal.   HIV: sees ID at [facility] Dr. Tommy Medal- he states he is very compliant with his medication regiment, no side effects, appetite is good and he has been gaining weight. .He was diagnosed in 06/2014. He is still sexually active, states uses condoms and partners are aware of his diagnosed. He was recently treated for anal chlamydia.   TMJ: noticed symptoms after wisdom teeth extraction 4 years ago, started to have popping sensation on left jaw about one year ago, now on both sides and associated with dull aching pain daily, but not constant. He states that popping his jaw makes it feel better. No swelling, no pain when he chews. Usually worse when he first wakes up. Discussed exercises, what to avoid and consider seeing dentist to have a mouth guard done.   Low HDL: he is eating  fish a couple of times a week, and some tree nuts. We will recheck labs   Patient Active Problem List   Diagnosis Date Noted  . Low HDL (under 40) 07/07/2015  . Allergic contact dermatitis 11/09/2014  . Chronic constipation 11/09/2014  . History of gonorrhea 11/09/2014  . History of giardia infection 11/09/2014  . HIV disease ([place]) 08/06/2014  . Asthma, mild intermittent 08/06/2014  . Seasonal allergic rhinitis  08/31/2006    Past Surgical History:  Procedure Laterality Date  . WISDOM TOOTH EXTRACTION      Family History  Problem Relation Age of Onset  . Asthma Mother   . Allergic rhinitis Mother   . Diabetes Mother   . Asthma Brother        Younger Brother    Social History   Social History  . Marital status: Single    Spouse name: N/A  . Number of children: N/A  . Years of education: N/A   Occupational History  . Not on file.   Social History Main Topics  . Smoking status: Never Smoker  . Smokeless tobacco: Never Used  . Alcohol use 0.0 oz/week     Comment: very seldom, but binges sometimes  . Drug use: No  . Sexual activity: Yes    Partners: Male    Birth control/ protection: Condom   Other Topics Concern  . 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      Current Outpatient Prescriptions:  .  abacavir-dolutegravir-lamiVUDine (TRIUMEQ) 600-50-300 MG tablet, TAKE 1 TABLET BY MOUTH DAILY. (PLEASE SCHEDULE FOLLOW UP APPT [phone]), Disp: 30 tablet, Rfl: 2 .  albuterol (VENTOLIN HFA) 108 (90 Base) MCG/ACT inhaler, Inhale 2 puffs into the  lungs every 4 (four) hours as needed for wheezing or shortness of breath., Disp: 18 g, Rfl: 0 .  TRIUMEQ 600-50-300 MG tablet, TAKE 1 TABLET BY MOUTH DAILY. (PLEASE SCHEDULE FOLLOW UP APPT [phone]), Disp: 30 tablet, Rfl: 3 .  fluticasone furoate-vilanterol (BREO ELLIPTA) 100-25 MCG/INH AEPB, Inhale 1 puff into the lungs daily. (Patient not taking: Reported on 09/20/2016), Disp: 60 each, Rfl: 2  No Known Allergies   ROS  Constitutional: Negative for fever, positive for weight change.  Respiratory: Positive  for cough and shortness of breath over the past two weeks.   Cardiovascular: Negative for chest pain or palpitations.  Gastrointestinal: Negative for abdominal pain, no bowel changes.  Musculoskeletal: Negative for  gait problem or joint swelling.  Skin: Positive  for rash /resolving on right leg  Neurological: Negative for dizziness or headache.  No other specific complaints in a complete review of systems (except as listed in HPI above).  Objective  Vitals:   12/09/16 1104  BP: 130/76  Pulse: 98  Resp: 16  Temp: 98.3 F (36.8 C)  TempSrc: Oral  SpO2: 95%  Weight: 183 lb 11.2 oz (83.3 kg)  Height: 5' 6"  (1.676 m)    Body mass index is 29.65 kg/m.  Physical Exam  Constitutional: Patient appears well-developed and well-nourished. Overweight. No distress.  HEENT: head atraumatic, normocephalic, pupils equal and reactive to light, normal turbinates, neck supple, throat within normal limits Cardiovascular: Normal rate, regular rhythm and normal heart sounds.  No murmur heard. No BLE edema. Pulmonary/Chest: Effort normal and breath sounds normal. No respiratory distress. Abdominal: Soft.  There is no tenderness. Psychiatric: Patient has a normal mood and affect. behavior is normal. Judgment and thought content normal.  Recent Results (from the past 2160 hour(s))  Cytology (rectal) ancillary only     Status: Abnormal   Collection Time: 09/20/16 12:00 AM  Result Value Ref Range   Chlamydia **POSITIVE** (A)     Comment: Normal Reference Range - Negative   Neisseria gonorrhea Negative     Comment: Normal Reference Range - Negative  Cytology (oral) ancillary only     Status: None   Collection Time: 09/20/16 12:00 AM  Result Value Ref Range   Chlamydia Negative     Comment: Normal Reference Range - Negative   Neisseria gonorrhea Negative     Comment: Normal Reference Range - Negative  Urine cytology ancillary only     Status: None   Collection Time: 09/20/16 12:00 AM  Result Value Ref Range   Chlamydia Negative     Comment: Normal Reference Range - Negative   Neisseria gonorrhea Negative     Comment: Normal Reference Range - Negative  HIV RNA, RTPCR W/R GT (RTI, PI,INT)     Status:  None   Collection Time: 09/20/16  3:04 PM  Result Value Ref Range   HIV-1 RNA, QN PCR <20 DETECTED copies/mL   HIV-1 RNA, QN PCR <1.30 DETECTED Log copies/mL    Comment: This patient had an HIV-1 RNA viral load below the lower limit of Quantitation for this assay (20 copies/mL), but above the lower limit of Detection for the test. Because of the low viral load, we were unable to determine the actual level of the virus in this patient's sample. Repeat testing may be warranted, when clinically indicated. REFERENCE RANGE:           NOT DETECTED  copies/mL           NOT DETECTED  Log copies/mL  This test was performed using Real-Time Polymerase Chain Reaction.   Reportable range is 20 to 10,000,000 copies/mL (1.30-7.00 Log copies/mL).   T-helper cell (CD4)- ([facility] clinic only)     Status: None   Collection Time: 09/20/16  3:04 PM  Result Value Ref Range   CD4 T Cell Abs 1,810 400 - 2,700 /uL   CD4 % Helper T Cell 37 33 - 55 %    Comment: Performed at [facility], [place] [address], [place], [place] [address]  CBC with Differential/Platelet     Status: Abnormal   Collection Time: 09/20/16  5:23 PM  Result Value Ref Range   WBC 7.4 3.8 - 10.8 K/uL   RBC 4.95 4.20 - 5.80 MIL/uL   Hemoglobin 15.6 13.2 - 17.1 g/dL   HCT 44.4 38.5 - 50.0 %   MCV 89.7 80.0 - 100.0 fL   MCH 31.5 27.0 - 33.0 pg   MCHC 35.1 32.0 - 36.0 g/dL   RDW 13.5 11.0 - 15.0 %   Platelets 260 140 - 400 K/uL   MPV 9.6 7.5 - 12.5 fL   Neutro Abs 2,146 1,500 - 7,800 cells/uL   Lymphs Abs 4,514 (H) 850 - 3,900 cells/uL   Monocytes Absolute 370 200 - 950 cells/uL   Eosinophils Absolute 370 15 - 500 cells/uL   Basophils Absolute 0 0 - 200 cells/uL   Neutrophils Relative % 29 %   Lymphocytes Relative 61 %   Monocytes Relative 5 %   Eosinophils Relative 5 %   Basophils Relative 0 %   Smear Review Criteria for review not met   COMPLETE METABOLIC PANEL WITH GFR     Status: None   Collection Time:  09/20/16  5:23 PM  Result Value Ref Range   Sodium 141 135 - 146 mmol/L   Potassium 4.1 3.5 - 5.3 mmol/L   Chloride 106 98 - 110 mmol/L   CO2 25 20 - 31 mmol/L   Glucose, Bld 90 65 - 99 mg/dL   BUN 19 7 - 25 mg/dL   Creat 1.06 0.60 - 1.35 mg/dL   Total Bilirubin 0.7 0.2 - 1.2 mg/dL   Alkaline Phosphatase 74 40 - 115 U/L   AST 18 10 - 40 U/L   ALT 34 9 - 46 U/L   Total Protein 7.1 6.1 - 8.1 g/dL   Albumin 4.6 3.6 - 5.1 g/dL   Calcium 9.5 8.6 - 10.3 mg/dL   GFR, Est African American >89 >=60 mL/min   GFR, Est Non African American >89 >=60 mL/min  RPR     Status: None   Collection Time: 09/20/16  5:23 PM  Result Value Ref Range   RPR Ser Ql NON REAC NON REAC    PHQ2/9: Depression screen [facility] 2/9 12/09/2016 10/06/2015 06/08/2015 05/14/2015 02/05/2015  Decreased Interest 0 0 0 0 0  Down, Depressed, Hopeless 0 0 0 0 0  PHQ - 2 Score 0 0 0 0 0    Fall Risk: Fall Risk  12/09/2016 10/06/2015 06/08/2015 05/14/2015 02/05/2015  Falls in the past year? No No No No No    Functional Status Survey: Is the patient deaf or have difficulty hearing?: No Does the patient have difficulty seeing, even when wearing glasses/contacts?: No Does the patient have difficulty concentrating, remembering, or making decisions?: No Does the patient have difficulty walking or climbing stairs?: No Does the patient have difficulty dressing or bathing?: No Does the patient have difficulty doing errands alone such as visiting  a doctor's office or shopping?: No    Assessment & Plan  1. Mild intermittent asthma with acute exacerbation  - Spirometry: Pre & Post Eval - albuterol (PROVENTIL) (2.5 MG/3ML) 0.083% nebulizer solution 2.5 mg; Take 3 mLs (2.5 mg total) by nebulization once. - albuterol (VENTOLIN HFA) 108 (90 Base) MCG/ACT inhaler; Inhale 2 puffs into the lungs every 4 (four) hours as needed for wheezing or shortness of breath.  Dispense: 18 g; Refill: 0 - fluticasone furoate-vilanterol (BREO ELLIPTA)  100-25 MCG/INH AEPB; Inhale 1 puff into the lungs daily.  Dispense: 60 each; Refill: 2  2. HIV disease ([place])  He has been compliant with medication and with follow ups with ID  3. Low HDL (under 40)  -lipid panel and discussed ways to bring level up  4. Needs flu shot  - Flu Vaccine QUAD 36+ mos IM  5. Need for pneumococcal vaccine  - Pneumococcal conjugate vaccine 13-valent IM  6. Diabetes mellitus screening  - Hemoglobin A1c

## 2016-12-28 ENCOUNTER — Other Ambulatory Visit: Payer: Self-pay

## 2017-01-10 ENCOUNTER — Emergency Department (HOSPITAL_COMMUNITY): Admission: EM | Admit: 2017-01-10 | Discharge: 2017-01-10 | Discharge status: Absent without leave | Payer: Self-pay

## 2017-01-11 ENCOUNTER — Other Ambulatory Visit (HOSPITAL_COMMUNITY)
Admission: RE | Admit: 2017-01-11 | Discharge: 2017-01-11 | Disposition: A | Discharge status: Home / Self Care | Payer: 59 | Source: Ambulatory Visit | Attending: Infectious Disease | Admitting: Infectious Disease

## 2017-01-11 ENCOUNTER — Encounter: Payer: Self-pay | Admitting: Infectious Disease

## 2017-01-11 ENCOUNTER — Ambulatory Visit (INDEPENDENT_AMBULATORY_CARE_PROVIDER_SITE_OTHER): Payer: 59 | Admitting: Infectious Disease

## 2017-01-11 VITALS — BP 131/83 | HR 91 | Temp 99.2°F | Ht 66.0 in | Wt 188.0 lb

## 2017-01-11 DIAGNOSIS — M25512 Pain in left shoulder: Secondary | ICD-10-CM | POA: Diagnosis not present

## 2017-01-11 DIAGNOSIS — Z8619 Personal history of other infectious and parasitic diseases: Secondary | ICD-10-CM | POA: Diagnosis not present

## 2017-01-11 DIAGNOSIS — J4520 Mild intermittent asthma, uncomplicated: Secondary | ICD-10-CM | POA: Diagnosis not present

## 2017-01-11 DIAGNOSIS — B20 Human immunodeficiency virus [HIV] disease: Secondary | ICD-10-CM | POA: Diagnosis not present

## 2017-01-11 DIAGNOSIS — A749 Chlamydial infection, unspecified: Secondary | ICD-10-CM | POA: Diagnosis not present

## 2017-01-11 HISTORY — DX: Pain in left shoulder: M25.512

## 2017-01-11 MED ORDER — ABACAVIR-DOLUTEGRAVIR-LAMIVUD 600-50-300 MG PO TABS: 1.0000 | ORAL_TABLET | Freq: Every day | ORAL | 11 refills | Status: DC

## 2017-01-11 NOTE — Progress Notes (Signed)
HPI: David Mendez is a 21 y.o. male who presents to the [facility] clinic today to follow-up with Dr. Daiva EvesVan Dam for his HIV infection.   Allergies: No Known Allergies  Past Medical History: Past Medical History:  Diagnosis Date  . Acute pain of left shoulder 01/11/2017  . Allergy   . Asthma   . Asthma, chronic 08/06/2014  . Chlamydia 06/08/2015  . History of giardia infection   . HIV antibody positive (HCC)   . HIV disease (HCC) 08/06/2014  . Industrial dermatitis   . Loss of weight     Social History: Social History   Social History  . Marital status: Single    Spouse name: N/A  . Number of children: N/A  . Years of education: N/A   Social History Main Topics  . Smoking status: Never Smoker  . Smokeless tobacco: Never Used  . Alcohol use 0.0 oz/week     Comment: very seldom, but binges sometimes  . Drug use: No  . Sexual activity: Yes    Partners: Male    Birth control/ protection: Condom   Other Topics Concern  . 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     Current Regimen: Triumeq  Labs: HIV 1 RNA Quant (copies/mL)  Date Value  05/25/2015 32 (H)  11/12/2014 92 (H)  09/10/2014 203 (H)   CD4 T Cell Abs (/uL)  Date Value  09/20/2016 1,810  05/25/2015 900  11/12/2014 910   Hep B S Ab (no units)  Date Value  07/22/2014 POS (A)   Hepatitis B Surface Ag (no units)  Date Value  07/22/2014 NEGATIVE   HCV Ab (no units)  Date Value  07/22/2014 NEGATIVE    CrCl: CrCl cannot be calculated (Patient's most recent lab result is older than the maximum 21 days allowed.).  Lipids:    Component Value Date/Time   CHOL 169 05/25/2015 0841   TRIG 134 05/25/2015 0841   HDL 34 (L) 05/25/2015 0841   CHOLHDL 5.0 05/25/2015 0841   VLDL 27 05/25/2015 0841   LDLCALC 108 05/25/2015 0841    Assessment: David Mendez is here today to follow-up with Dr. Daiva EvesVan Dam for his  HIV infection. He is currently on Triumeq. No issues taking the medication or with side effects.  Undetectable viral load when last checked in July.  [facility] and he will have [facility].  Expl[facility] the [facility] and how they will have to call him monthly to check if it is ok to mail.   Plans: - Continue Triumeq PO once daily - [facility]  Cassie L. Kuppelweiser, PharmD, CPP Infectious Diseases Clinical Pharmacist [facility] 01/11/2017, 9:38 AM

## 2017-01-11 NOTE — Progress Notes (Signed)
Chief complaint: followup for  HIV on medications and now with worsening of chronic shoulder pain.  Subjective:    Patient ID: David Mendez, male    DOB: [birth year], 21 y.o.   MRN: [id number]  HPI  David Mendez is a 21 year-old African-American male with HIV disease who is currently on TRIUMEQ and reasonably virally suppressed  Lab Results  Component Value Date   HIV1RNAQUANT 32 (H) 05/25/2015   HIV1RNAQUANT 92 (H) 11/12/2014   HIV1RNAQUANT 203 (H) 09/10/2014    Lab Results  Component Value Date   CD4TABS 1,810 09/20/2016   CD4TABS 900 05/25/2015   CD4TABS 910 11/12/2014    He comes in today with acute worsening of chronic left shoulder pain.  He woke up on Monday and was barely able to move his shoulder upwards.  This severely limited his abilities to work in fact last night he was seen completely incapable of working.  Pain is extremely sharp and worsened by him trying to abduct or elevate his arm above 50 degrees laterally.  Rates pain as being 7 out of 10 in severity.  It is made better by him not moving it and try to keep it as still as possible.  Tylenol is improved her symptoms slightly ibuprofen has reduced severity to 4 or 5 out of 10 when he is keeping it in a rested position.  He does not have nausea fevers erythema about the joint or other systemic symptoms.  Going to go to emergency room today but I counseled him that we should be able to establish him with an orthopedic surgeon to evaluate his musculoskeletal problem.  I reviewed his plain films of his shoulder done several months ago on September 13, 2016 and these show no evidence of a fracture or any evidence of dislocation.  Past Medical History:  Diagnosis Date  . Acute pain of left shoulder 01/11/2017  . Allergy   . Asthma   . Asthma, chronic 08/06/2014  . Chlamydia 06/08/2015  . History of giardia infection   . HIV antibody positive (HCC)   . HIV disease (HCC) 08/06/2014  . Industrial dermatitis   . Loss of  weight    Past Surgical History:  Procedure Laterality Date  . WISDOM TOOTH EXTRACTION       Family History  Problem Relation Age of Onset  . Asthma Mother   . Allergic rhinitis Mother   . Diabetes Mother   . Asthma Brother        Younger Brother    Social History  Substance Use Topics  . Smoking status: Never Smoker  . Smokeless tobacco: Never Used  . Alcohol use 0.0 oz/week     Comment: very seldom, but binges sometimes    Current Outpatient Prescriptions:  .  abacavir-dolutegravir-lamiVUDine (TRIUMEQ) 600-50-300 MG tablet, TAKE 1 TABLET BY MOUTH DAILY. (PLEASE SCHEDULE FOLLOW UP APPT [id number]), Disp: 30 tablet, Rfl: 2 .  albuterol (VENTOLIN HFA) 108 (90 Base) MCG/ACT inhaler, Inhale 2 puffs into the lungs every 4 (four) hours as needed for wheezing or shortness of breath., Disp: 18 g, Rfl: 0 .  fluticasone furoate-vilanterol (BREO ELLIPTA) 100-25 MCG/INH AEPB, Inhale 1 puff into the lungs daily., Disp: 60 each, Rfl: 2 .  TRIUMEQ 600-50-300 MG tablet, TAKE 1 TABLET BY MOUTH DAILY. (PLEASE SCHEDULE FOLLOW UP APPT [phone] [id number]), Disp: 30 tablet, Rfl: 3  No Known Allergies    Review of Systems  Constitutional: Negative for activity change, appetite change, chills, diaphoresis, fatigue, fever and  unexpected weight change.  HENT: Negative for congestion, rhinorrhea, sinus pressure, sneezing, sore throat and trouble swallowing.   Eyes: Negative for photophobia and visual disturbance.  Respiratory: Negative for cough, chest tightness, shortness of breath, wheezing and stridor.   Cardiovascular: Negative for chest pain, palpitations and leg swelling.  Gastrointestinal: Negative for abdominal distention, abdominal pain, anal bleeding, blood in stool, constipation, diarrhea, nausea and vomiting.  Genitourinary: Negative for difficulty urinating, dysuria, flank pain and hematuria.  Musculoskeletal: Positive for joint swelling and myalgias. Negative for arthralgias, back pain and  gait problem.  Skin: Negative for color change, pallor, rash and wound.  Neurological: Negative for dizziness, tremors, weakness and light-headedness.  Hematological: Negative for adenopathy. Does not bruise/bleed easily.  Psychiatric/Behavioral: Negative for agitation, behavioral problems, confusion, decreased concentration, dysphoric mood and sleep disturbance.       Objective:   Physical Exam  Constitutional: He is oriented to person, place, and time. He appears well-developed and well-nourished.  HENT:  Head: Normocephalic and atraumatic.  Eyes: Conjunctivae and EOM are normal.  Neck: Normal range of motion. Neck supple.  Cardiovascular: Normal rate and regular rhythm.   Pulmonary/Chest: Effort normal. No respiratory distress. He has no wheezes.  Abdominal: Soft. He exhibits no distension.  Musculoskeletal: He exhibits no edema.       Left shoulder: He exhibits decreased range of motion, tenderness and pain. He exhibits no effusion, no crepitus and no deformity.  Neurological: He is alert and oriented to person, place, and time.  Skin: Skin is warm and dry. No rash noted. No erythema. No pallor.  Psychiatric: He has a normal mood and affect. His behavior is normal. Judgment and thought content normal.          Assessment & Plan:   New acute worsening of shoulder pain: I reviewed his plain films of shoulder myself and agree no fracture. I have referred him to Orthopedic surgery  HIV disease: continue TRIUMEQ. We reviewed his labs from July and he remains with viral load less than 20 and a healthy CD4 count.  We will continue his TRIUMEQ but change his mediciine rx over to [facility] [facility] to better track adherence (he was due to run out of meds very soon)   Asthma: Mild intermittent and not requiring corticosteroids or daily bronchodilators.   Hx of Gonorrhea and chlamydia: will recheck for both genital and extragenital today to ensure they have not  returned    Acey Lav, MD

## 2017-01-12 ENCOUNTER — Ambulatory Visit: Payer: 59 | Admitting: Family Medicine

## 2017-01-12 ENCOUNTER — Ambulatory Visit (INDEPENDENT_AMBULATORY_CARE_PROVIDER_SITE_OTHER): Payer: Self-pay | Admitting: Orthopaedic Surgery

## 2017-01-12 LAB — URINE CYTOLOGY ANCILLARY ONLY
Chlamydia: NEGATIVE
Neisseria Gonorrhea: NEGATIVE

## 2017-01-12 LAB — CYTOLOGY, (ORAL, ANAL, URETHRAL) ANCILLARY ONLY
CHLAMYDIA, DNA PROBE: NEGATIVE
Chlamydia: NEGATIVE
Neisseria Gonorrhea: NEGATIVE
Neisseria Gonorrhea: NEGATIVE

## 2017-01-13 ENCOUNTER — Encounter (INDEPENDENT_AMBULATORY_CARE_PROVIDER_SITE_OTHER): Payer: Self-pay

## 2017-01-13 ENCOUNTER — Ambulatory Visit (INDEPENDENT_AMBULATORY_CARE_PROVIDER_SITE_OTHER): Payer: 59 | Admitting: Orthopaedic Surgery

## 2017-01-13 DIAGNOSIS — G8929 Other chronic pain: Secondary | ICD-10-CM

## 2017-01-13 DIAGNOSIS — M25512 Pain in left shoulder: Secondary | ICD-10-CM

## 2017-01-13 MED ORDER — TIZANIDINE HCL 4 MG PO TABS: 4.0000 mg | ORAL_TABLET | Freq: Four times a day (QID) | ORAL | 2 refills | Status: DC | PRN

## 2017-01-13 MED ORDER — DICLOFENAC SODIUM 75 MG PO TBEC: 75.0000 mg | DELAYED_RELEASE_TABLET | Freq: Two times a day (BID) | ORAL | 2 refills | Status: DC

## 2017-01-13 MED FILL — TRIUMEQ 600-50-300 MG TABS: 600-50-300 | 30 days supply | Qty: 30 | Fill #0

## 2017-01-13 NOTE — Progress Notes (Signed)
Office Visit Note   Patient: David Mendez           Date of Birth: May 23, 1995           MRN: [id number] Visit Date: 01/13/2017              Requested by: Alba Cory, MD [address] [place], [place] [address] PCP: Alba Cory, MD   Assessment & Plan: Visit Diagnoses:  1. Chronic left shoulder pain     Plan: Patient denies any constitutional symptoms or any chest pain or shortness of breath.  I think this is more of a overuse muscular issue.  Prescription for diclofenac and Zanaflex.  Referral to physical therapy.  No use of the left arm for 6 weeks at work.  Follow-up as needed.  Follow-Up Instructions: Return if symptoms worsen or fail to improve.   Orders:  No orders of the defined types were placed in this encounter.  Meds ordered this encounter  Medications  . tiZANidine (ZANAFLEX) 4 MG tablet    Sig: Take 1 tablet (4 mg total) by mouth every 6 (six) hours as needed for muscle spasms.    Dispense:  30 tablet    Refill:  2  . diclofenac (VOLTAREN) 75 MG EC tablet    Sig: Take 1 tablet (75 mg total) by mouth 2 (two) times daily.    Dispense:  30 tablet    Refill:  2      Procedures: No procedures performed   Clinical Data: No additional findings.   Subjective: No chief complaint on file.   Patient is a healthy 21 year old gentleman who comes in with left shoulder pain and discomfort more in the supraclavicular region in the shoulder itself.  He denies any injuries.  Denies any symptoms of radiculopathy.  He is right-hand dominant.  He feels like there is a pulling sensation.  He is holding his arm by his abdomen.  He was evaluated in the ER recently.    Review of Systems  Constitutional: Negative.   All other systems reviewed and are negative.    Objective: Vital Signs: There were no vitals taken for this visit.  Physical Exam  Constitutional: He is oriented to person, place, and time. He appears well-developed and  well-nourished.  HENT:  Head: Normocephalic and atraumatic.  Eyes: Pupils are equal, round, and reactive to light.  Neck: Neck supple.  Pulmonary/Chest: Effort normal.  Abdominal: Soft.  Musculoskeletal: Normal range of motion.  Neurological: He is alert and oriented to person, place, and time.  Skin: Skin is warm.  Psychiatric: He has a normal mood and affect. His behavior is normal. Judgment and thought content normal.  Nursing note and vitals reviewed.   Ortho Exam Left shoulder exam shows no palpable lesions or masses or skin changes.  He has no focal findings related to the shoulder.  Has discomfort localized to the supraclavicular and trapezius region. Specialty Comments:  No specialty comments available.  Imaging: No results found.   PMFS History: Patient Active Problem List   Diagnosis Date Noted  . Chronic left shoulder pain 01/13/2017  . Acute pain of left shoulder 01/11/2017  . Low HDL (under 40) 07/07/2015  . Allergic contact dermatitis 11/09/2014  . Chronic constipation 11/09/2014  . History of gonorrhea 11/09/2014  . History of giardia infection 11/09/2014  . HIV disease (HCC) 08/06/2014  . Asthma, mild intermittent 08/06/2014  . Seasonal allergic rhinitis 08/31/2006   Past Medical History:  Diagnosis Date  .  Acute pain of left shoulder 01/11/2017  . Allergy   . Asthma   . Asthma, chronic 08/06/2014  . Chlamydia 06/08/2015  . History of giardia infection   . HIV antibody positive (HCC)   . HIV disease (HCC) 08/06/2014  . Industrial dermatitis   . Loss of weight     Family History  Problem Relation Age of Onset  . Asthma Mother   . Allergic rhinitis Mother   . Diabetes Mother   . Asthma Brother        Younger Brother    Past Surgical History:  Procedure Laterality Date  . WISDOM TOOTH EXTRACTION     Social History   Occupational History  . Not on file.   Social History Main Topics  . Smoking status: Never Smoker  . Smokeless tobacco:  Never Used  . Alcohol use 0.0 oz/week     Comment: very seldom, but binges sometimes  . Drug use: No  . Sexual activity: Yes    Partners: Male    Birth control/ protection: Condom

## 2017-01-25 ENCOUNTER — Ambulatory Visit: Payer: 59 | Attending: Orthopaedic Surgery | Admitting: Physical Therapy

## 2017-01-25 ENCOUNTER — Encounter: Payer: Self-pay | Admitting: Physical Therapy

## 2017-01-25 DIAGNOSIS — M25512 Pain in left shoulder: Secondary | ICD-10-CM | POA: Insufficient documentation

## 2017-01-25 DIAGNOSIS — M25612 Stiffness of left shoulder, not elsewhere classified: Secondary | ICD-10-CM | POA: Insufficient documentation

## 2017-01-25 DIAGNOSIS — M542 Cervicalgia: Secondary | ICD-10-CM | POA: Diagnosis not present

## 2017-01-25 NOTE — Therapy (Signed)
[facility] [address] [place], [place], [address] Phone: [phone]   Fax:  [phone]  Physical Therapy Evaluation  Patient Details  Name: David Mendez MRN: [id number] Date of Birth: 15-Jul-1995 Referring Provider: Ronnell Freshwater   Encounter Date: 01/25/2017  PT End of Session - 01/25/17 0910    Visit Number  1    Date for PT Re-Evaluation  03/27/17    PT Start Time  0845    PT Stop Time  0937    PT Time Calculation (min)  52 min    Activity Tolerance  Patient tolerated treatment well    Behavior During Therapy  [facility] for tasks assessed/performed       Past Medical History:  Diagnosis Date  . Acute pain of left shoulder 01/11/2017  . Allergy   . Asthma   . Asthma, chronic 08/06/2014  . Chlamydia 06/08/2015  . History of giardia infection   . HIV antibody positive (HCC)   . HIV disease (HCC) 08/06/2014  . Industrial dermatitis   . Loss of weight     Past Surgical History:  Procedure Laterality Date  . WISDOM TOOTH EXTRACTION      There were no vitals filed for this visit.   Subjective Assessment - 01/25/17 0845    Subjective  Patient  reports 5 months of left shoulder pain, he is unsure of a cause but reports that his job is repetetive motions.  X-rays negative.  Reports that he has been out of work for 2 weeks, reports he is still hurting, reports he feels it is in the left upper trap and extends to the neck    Patient Stated Goals  have less pain, work without difficulty    Currently in Pain?  Yes    Pain Score  4     Pain Location  Shoulder    Pain Orientation  Left;Upper    Pain Descriptors / Indicators  Aching;Throbbing    Pain Type  Acute pain    Pain Onset  More than a month ago    Pain Frequency  Constant    Aggravating Factors   moving head, arm, sleeping on the left side pain a 9/10    Pain Relieving Factors  holding arm in a sling type posistion, in AM, at best pain a 3/10    Effect of Pain on  Daily Activities  difficulty driving, dressing sleeping         [facility] PT Assessment - 01/25/17 0001      Assessment   Medical Diagnosis  left shoulder pain    Referring Provider  NM Xu    Onset Date/Surgical Date  08/25/16    Hand Dominance  Right    Prior Therapy  no      Precautions   Precautions  None      Balance Screen   Has the patient fallen in the past 6 months  No    Has the patient had a decrease in activity level because of a fear of falling?   No    Is the patient reluctant to leave their home because of a fear of falling?   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      Posture/Postural Control   Posture Comments  hold  the left arm in a sling type position against his body, rounded shoulders, fwd head      ROM / Strength   AROM / PROM / Strength  AROM;Strength;PROM      AROM   Overall AROM Comments  cervical ROM decreased 25% for all motions except right rotation and sidebending was decreased 50% with pain in the left upper trap and neck area, all motions were gaurded    AROM Assessment Site  Shoulder    Right/Left Shoulder  Left    Left Shoulder Flexion  110 Degrees    Left Shoulder ABduction  110 Degrees    Left Shoulder Internal Rotation  60 Degrees    Left Shoulder External Rotation  40 Degrees      PROM   PROM Assessment Site  Shoulder    Right/Left Shoulder  Left    Left Shoulder Flexion  122 Degrees    Left Shoulder ABduction  120 Degrees    Left Shoulder Internal Rotation  70 Degrees    Left Shoulder External Rotation  45 Degrees      Strength   Overall Strength Comments  left shoulder 4/5 for ER/IR with pain, flexion and abduction 3+/5      Palpation   Palpation comment  tender supra clavicle area, the upper trap is tight but non tender      Special Tests    Special Tests  -- pain with impingement,  some pain with empty can but strong   pain with impingement, some pain with empty can but strong            Objective measurements completed on examination: See above findings.      [facility] Adult PT Treatment/Exercise - 01/25/17 0001      Modalities   Modalities  Electrical Stimulation;Moist Heat      Moist Heat Therapy   Number Minutes Moist Heat  15 Minutes    Moist Heat Location  Shoulder      Electrical Stimulation   Electrical Stimulation Location  left clavicle area    Electrical Stimulation Action  IFC    Electrical Stimulation Parameters  sitting    Electrical Stimulation Goals  Pain             PT Education - 01/25/17 0909    Education provided  Yes    Education Details  shrugs, cervcial and scapular retractions, arm distraction with cervical stretches    Person(s) Educated  Patient    Methods  Explanation;Handout;Demonstration    Comprehension  Verbalized understanding       PT Short Term Goals - 01/25/17 0916      PT SHORT TERM GOAL #1   Title  independent with initial HEP    Time  2    Period  Weeks    Status  New        PT Long Term Goals - 01/25/17 0916      PT LONG TERM GOAL #1   Title  decrease pain 50%    Time  8    Period  Weeks    Status  New      PT LONG TERM GOAL #2   Title  increase left shoulder ROM to WFL's    Time  8    Period  Weeks    Status  New      PT LONG TERM GOAL #3   Title  increase ROM of the cervical spine to WFL's    Time  8  Period  Weeks    Status  New      PT LONG TERM GOAL #4   Title  understand proper posture and body mechanics    Time  8    Period  Weeks    Status  New             Plan - 01/25/17 0910    Clinical Impression Statement  Patient reports left shoulder/neck pain since June, he is unsure of a specific cause, reports that his job is repetetive but light, he is out of work now but reports not much change in pain, x-rays were negative, his pain is moslty in the supra  clavicle area, he is tender along the clavicle and superiorly.  Upper trap is tight but non tender, his cervical ROM is limited, his shoulder ROM is limited, he holds the left arm in a sling type position.  some pain with impingement test, seems to be more in the anterior and side of the neck area    Clinical Presentation  Evolving    Clinical Decision Making  Low    Rehab Potential  Good    PT Frequency  2x / week    PT Duration  8 weeks    PT Treatment/Interventions  ADLs/Self Care Home Management;Cryotherapy;Electrical Stimulation;Iontophoresis 4mg /ml Dexamethasone;Moist Heat;Ultrasound;Therapeutic activities;Therapeutic exercise;Patient/family education;Manual techniques;Taping;Vasopneumatic Device    PT Next Visit Plan  slowly start exercises    Consulted and Agree with Plan of Care  Patient       Patient will benefit from skilled therapeutic intervention in order to improve the following deficits and impairments:  Impaired UE functional use, Decreased strength, Decreased range of motion, Increased muscle spasms, Postural dysfunction  Visit Diagnosis: Cervicalgia - Plan: PT plan of care cert/re-cert  Acute pain of left shoulder - Plan: PT plan of care cert/re-cert  Stiffness of left shoulder, not elsewhere classified - Plan: PT plan of care cert/re-cert     Problem List Patient Active Problem List   Diagnosis Date Noted  . Chronic left shoulder pain 01/13/2017  . Acute pain of left shoulder 01/11/2017  . Low HDL (under 40) 07/07/2015  . Allergic contact dermatitis 11/09/2014  . Chronic constipation 11/09/2014  . History of gonorrhea 11/09/2014  . History of giardia infection 11/09/2014  . HIV disease (HCC) 08/06/2014  . Asthma, mild intermittent 08/06/2014  . Seasonal allergic rhinitis 08/31/2006    Jearld LeschALBRIGHT,Lukis Bunt W., PT 01/25/2017, 9:20 AM  [facility] [address] [place], [place], [address] Phone:  [phone]   Fax:  [phone]  Name: Delsa GranaJaylen T Erker MRN: [id number] Date of Birth: 08-04-1995

## 2017-01-30 ENCOUNTER — Ambulatory Visit: Payer: 59 | Admitting: Physical Therapy

## 2017-01-30 ENCOUNTER — Telehealth: Payer: Self-pay | Admitting: Physical Therapy

## 2017-01-30 ENCOUNTER — Other Ambulatory Visit: Payer: Self-pay

## 2017-01-30 ENCOUNTER — Encounter: Payer: Self-pay | Admitting: Physical Therapy

## 2017-01-30 DIAGNOSIS — M542 Cervicalgia: Secondary | ICD-10-CM

## 2017-01-30 DIAGNOSIS — M25612 Stiffness of left shoulder, not elsewhere classified: Secondary | ICD-10-CM

## 2017-01-30 DIAGNOSIS — M25512 Pain in left shoulder: Secondary | ICD-10-CM

## 2017-01-30 DIAGNOSIS — J4521 Mild intermittent asthma with (acute) exacerbation: Secondary | ICD-10-CM

## 2017-01-30 NOTE — Telephone Encounter (Signed)
Patient requesting refill of Breo to [facility].  

## 2017-01-30 NOTE — Therapy (Signed)
[facility] [address] [place], [place], [address] Phone: [phone]   Fax:  [phone]  Physical Therapy Treatment  Patient Details  Name: David GranaJaylen T Mendez MRN: [id number] Date of Birth: [birth year] Referring Provider: Ronnell FreshwaterNM Xu   Encounter Date: 01/30/2017  PT End of Session - 01/30/17 1006    Visit Number  2    Date for PT Re-Evaluation  03/27/17    PT Start Time  0930    PT Stop Time  1019    PT Time Calculation (min)  49 min    Activity Tolerance  Patient tolerated treatment well    Behavior During Therapy  [facility] for tasks assessed/performed       Past Medical History:  Diagnosis Date  . Acute pain of left shoulder 01/11/2017  . Allergy   . Asthma   . Asthma, chronic 08/06/2014  . Chlamydia 06/08/2015  . History of giardia infection   . HIV antibody positive (HCC)   . HIV disease (HCC) 08/06/2014  . Industrial dermatitis   . Loss of weight     Past Surgical History:  Procedure Laterality Date  . WISDOM TOOTH EXTRACTION      There were no vitals filed for this visit.  Subjective Assessment - 01/30/17 0930    Subjective  Pt reports no change since Evaluation    Currently in Pain?  Yes    Pain Score  3     Pain Location  Shoulder    Pain Orientation  Left                      [facility] Adult PT Treatment/Exercise - 01/30/17 0001      Exercises   Exercises  Shoulder      Shoulder Exercises: Supine   Flexion  10 reps;Left;Weights    Shoulder Flexion Weight (lbs)  2      Shoulder Exercises: Standing   External Rotation  Strengthening;10 reps;Theraband x2    Theraband Level (Shoulder External Rotation)  Level 1 (Yellow)    Extension  10 reps;Theraband;Both x2    Theraband Level (Shoulder Extension)  Level 1 (Yellow)    Row  10 reps;Theraband;Both x2    Theraband Level (Shoulder Row)  Level 1 (Yellow)    Other Standing Exercises  shrugs 5lb 2x10       Modalities   Modalities   Electrical Stimulation;Moist Heat      Moist Heat Therapy   Number Minutes Moist Heat  15 Minutes    Moist Heat Location  Shoulder      Electrical Stimulation   Electrical Stimulation Location  left clavicle area    Electrical Stimulation Action  IFC    Electrical Stimulation Parameters  sitting    Electrical Stimulation Goals  Pain      Manual Therapy   Manual Therapy  Passive ROM    Manual therapy comments  PROM WNL does report a pull in th eanterior L shoulder at end range    Passive ROM  L shoulder all directions               PT Short Term Goals - 01/30/17 1010      PT SHORT TERM GOAL #1   Title  independent with initial HEP    Status  Achieved        PT Long Term Goals - 01/25/17 0916      PT LONG TERM GOAL #1   Title  decrease pain 50%    Time  8    Period  Weeks    Status  New      PT LONG TERM GOAL #2   Title  increase left shoulder ROM to WFL's    Time  8    Period  Weeks    Status  New      PT LONG TERM GOAL #3   Title  increase ROM of the cervical spine to WFL's    Time  8    Period  Weeks    Status  New      PT LONG TERM GOAL #4   Title  understand proper posture and body mechanics    Time  8    Period  Weeks    Status  New            Plan - 01/30/17 1007    Clinical Impression Statement  Pt tolerated an initial progression to exercises well, he does reports some discomfort with resisted external rotation. Pt has full L shoulder PROM  but does reports an pulling sensation in her anterior L shoulder at end ranges. Pt enters clinical protecting L shoulder as it was in sling, encourages pt to treat his shoulder as normal as possible to hep prevent frozen shoulder.    Rehab Potential  Good    PT Frequency  2x / week    PT Duration  8 weeks    PT Treatment/Interventions  ADLs/Self Care Home Management;Cryotherapy;Electrical Stimulation;Iontophoresis 4mg /ml Dexamethasone;Moist Heat;Ultrasound;Therapeutic activities;Therapeutic  exercise;Patient/family education;Manual techniques;Taping;Vasopneumatic Device    PT Next Visit Plan  slowly start exercises, assess TX       Patient will benefit from skilled therapeutic intervention in order to improve the following deficits and impairments:  Impaired UE functional use, Decreased strength, Decreased range of motion, Increased muscle spasms, Postural dysfunction  Visit Diagnosis: Acute pain of left shoulder  Stiffness of left shoulder, not elsewhere classified  Cervicalgia     Problem List Patient Active Problem List   Diagnosis Date Noted  . Chronic left shoulder pain 01/13/2017  . Acute pain of left shoulder 01/11/2017  . Low HDL (under 40) 07/07/2015  . Allergic contact dermatitis 11/09/2014  . Chronic constipation 11/09/2014  . History of gonorrhea 11/09/2014  . History of giardia infection 11/09/2014  . HIV disease (HCC) 08/06/2014  . Asthma, mild intermittent 08/06/2014  . Seasonal allergic rhinitis 08/31/2006    Grayce Sessionsonald G Elyanah Farino, PTA 01/30/2017, 10:11 AM  [facility] [address] [place], [place], [address] Phone: [phone]   Fax:  [phone]  Name: David GranaJaylen T Mendez MRN: [id number] Date of Birth: 04-19-1995

## 2017-01-31 MED ORDER — FLUTICASONE FUROATE-VILANTEROL 100-25 MCG/INH IN AEPB: 1.0000 | INHALATION_SPRAY | Freq: Every day | RESPIRATORY_TRACT | 2 refills | Status: DC

## 2017-02-01 ENCOUNTER — Encounter: Payer: Self-pay | Admitting: Physical Therapy

## 2017-02-01 ENCOUNTER — Ambulatory Visit: Payer: 59 | Admitting: Physical Therapy

## 2017-02-01 DIAGNOSIS — M542 Cervicalgia: Secondary | ICD-10-CM | POA: Diagnosis not present

## 2017-02-01 DIAGNOSIS — M25612 Stiffness of left shoulder, not elsewhere classified: Secondary | ICD-10-CM

## 2017-02-01 DIAGNOSIS — M25512 Pain in left shoulder: Secondary | ICD-10-CM

## 2017-02-01 NOTE — Therapy (Signed)
[place], [place], [address] Phone: [phone]   Fax:  [phone]  Physical Therapy Treatment  Patient Details  Name: David Mendez MRN: [id number] Date of Birth: [birth year] Referring Provider: Phineas Real   Encounter Date: 02/01/2017  PT End of Session - 02/01/17 1016    Visit Number  3    Date for PT Re-Evaluation  03/27/17    PT Start Time  0930    PT Stop Time  1024    PT Time Calculation (min)  54 min       Past Medical History:  Diagnosis Date  . Acute pain of left shoulder 01/11/2017  . Allergy   . Asthma   . Asthma, chronic 08/06/2014  . Chlamydia 06/08/2015  . History of giardia infection   . HIV antibody positive ([place])   . HIV disease ([place]) 08/06/2014  . Industrial dermatitis   . Loss of weight     Past Surgical History:  Procedure Laterality Date  . WISDOM TOOTH EXTRACTION      There were no vitals filed for this visit.  Subjective Assessment - 02/01/17 0928    Subjective  Pt reports that he is doing good    Currently in Pain?  No/denies    Pain Score  0-No pain                      [facility] Adult PT Treatment/Exercise - 02/01/17 0001      Shoulder Exercises: Seated   Other Seated Exercises  Rows & Lats 25lb 2x10       Shoulder Exercises: Standing   Horizontal ABduction  10 reps;Theraband;Both x2    Theraband Level (Shoulder Horizontal ABduction)  Level 2 (Red)    External Rotation  Strengthening;10 reps;Theraband x2    Theraband Level (Shoulder External Rotation)  Level 2 (Red)    Internal Rotation  Left;15 reps;Theraband x2    Theraband Level (Shoulder Internal Rotation)  Level 2 (Red)    Flexion  10 reps;Both;Weights x2    Shoulder Flexion Weight (lbs)  2    ABduction  10 reps;Weights;Both x2    Shoulder ABduction Weight (lbs)  2    Extension  10 reps;Theraband;Both x2    Theraband Level (Shoulder Extension)  Level 2 (Red)    Other Standing  Exercises  shrugs 5lb 2x10       Shoulder Exercises: ROM/Strengthening   UBE (Upper Arm Bike)  L3.5 45fd/2rev    Other ROM/Strengthening Exercises  NuStep L4 x 4 min       Modalities   Modalities  Electrical Stimulation;Moist Heat      Moist Heat Therapy   Number Minutes Moist Heat  15 Minutes    Moist Heat Location  Shoulder      Electrical Stimulation   Electrical Stimulation Location  left clavicle area    Electrical Stimulation Action  IFC    Electrical Stimulation Parameters  sitting     Electrical Stimulation Goals  Pain               PT Short Term Goals - 01/30/17 1010      PT SHORT TERM GOAL #1   Title  independent with initial HEP    Status  Achieved        PT Long Term Goals - 02/01/17 0959      PT LONG TERM GOAL #1   Title  decrease pain 50%  Status  Partially Met            Plan - 02/01/17 1013    Clinical Impression Statement  Pt with good carryover form last treatment with exercise tolerance. Again no issues with today's interventions. Pt able to do weighted shoulder flexion and abduction without pain.     Rehab Potential  Good    PT Frequency  2x / week    PT Treatment/Interventions  ADLs/Self Care Home Management;Cryotherapy;Electrical Stimulation;Iontophoresis 37m/ml Dexamethasone;Moist Heat;Ultrasound;Therapeutic activities;Therapeutic exercise;Patient/family education;Manual techniques;Taping;Vasopneumatic Device    PT Next Visit Plan  slowly start exercises, assess TX       Patient will benefit from skilled therapeutic intervention in order to improve the following deficits and impairments:  Impaired UE functional use, Decreased strength, Decreased range of motion, Increased muscle spasms, Postural dysfunction  Visit Diagnosis: Acute pain of left shoulder  Stiffness of left shoulder, not elsewhere classified  Cervicalgia     Problem List Patient Active Problem List   Diagnosis Date Noted  . Chronic left shoulder pain  01/13/2017  . Acute pain of left shoulder 01/11/2017  . Low HDL (under 40) 07/07/2015  . Allergic contact dermatitis 11/09/2014  . Chronic constipation 11/09/2014  . History of gonorrhea 11/09/2014  . History of giardia infection 11/09/2014  . HIV disease ([place] 08/06/2014  . Asthma, mild intermittent 08/06/2014  . Seasonal allergic rhinitis 08/31/2006    RScot Jun PTA 02/01/2017, 10:19 AM  [address] [address]: [phone]  Fax:  [phone] Name: David BRENSINGERMRN: [id number] of Birth: 107-10-1995

## 2017-02-06 ENCOUNTER — Ambulatory Visit: Payer: 59 | Admitting: Physical Therapy

## 2017-02-06 ENCOUNTER — Encounter: Payer: Self-pay | Admitting: Physical Therapy

## 2017-02-06 DIAGNOSIS — M542 Cervicalgia: Secondary | ICD-10-CM

## 2017-02-06 DIAGNOSIS — M25512 Pain in left shoulder: Secondary | ICD-10-CM

## 2017-02-06 DIAGNOSIS — M25612 Stiffness of left shoulder, not elsewhere classified: Secondary | ICD-10-CM

## 2017-02-06 NOTE — Therapy (Signed)
[place], [place], [address] Phone: [phone]   Fax:  [phone]  Physical Therapy Treatment  Patient Details  Name: David Mendez MRN: [id number] Date of Birth: [birth year] Referring Provider: Phineas Real   Encounter Date: 02/06/2017  PT End of Session - 02/06/17 0947    Visit Number  4    Date for PT Re-Evaluation  03/27/17    PT Start Time  0915    PT Stop Time  1002    PT Time Calculation (min)  47 min    Activity Tolerance  Patient tolerated treatment well    Behavior During Therapy  [facility] for tasks assessed/performed       Past Medical History:  Diagnosis Date  . Acute pain of left shoulder 01/11/2017  . Allergy   . Asthma   . Asthma, chronic 08/06/2014  . Chlamydia 06/08/2015  . History of giardia infection   . HIV antibody positive ([place])   . HIV disease ([place]) 08/06/2014  . Industrial dermatitis   . Loss of weight     Past Surgical History:  Procedure Laterality Date  . WISDOM TOOTH EXTRACTION      There were no vitals filed for this visit.  Subjective Assessment - 02/06/17 0915    Subjective  PT ~ 30 minutes late. Stated that he feels ok.    Pain Score  2     Pain Location  Shoulder    Pain Orientation  Left         [facility] PT Assessment - 02/06/17 0001      AROM   Overall AROM Comments  cervical ROM WFL    AROM Assessment Site  Shoulder    Right/Left Shoulder  Left    Left Shoulder Flexion  176 Degrees    Left Shoulder ABduction  180 Degrees    Left Shoulder Internal Rotation  46 Degrees    Left Shoulder External Rotation  87 Degrees                  [facility] Adult PT Treatment/Exercise - 02/06/17 0001      Shoulder Exercises: Seated   Other Seated Exercises  Rows & Lats 25lb 2x10     Other Seated Exercises  Bent over rows 5lb, 3lb ext, 3lb rev flys  2x10       Shoulder Exercises: Standing   External Rotation  10 reps;Strengthening;Weights x2     External Rotation Weight (lbs)  5    Internal Rotation  Left;10 reps;Weights x2    Internal Rotation Weight (lbs)  5    Flexion  10 reps;Both;Weights x2    Shoulder Flexion Weight (lbs)  2    ABduction  10 reps;Weights;Both x2    Shoulder ABduction Weight (lbs)  2    Other Standing Exercises  shrugs 5lb 2x10       Shoulder Exercises: ROM/Strengthening   UBE (Upper Arm Bike)  L4 24fd/2rev               PT Short Term Goals - 01/30/17 1010      PT SHORT TERM GOAL #1   Title  independent with initial HEP    Status  Achieved        PT Long Term Goals - 02/01/17 0959      PT LONG TERM GOAL #1   Title  decrease pain 50%    Status  Partially Met  Plan - 02/06/17 0949    Clinical Impression Statement  Pt has progressed with his L shoulder increasing all motion except for internal rotation. Pt has also progressed increasing her cervical ROM. Good strength and ROM with all exercises.     Rehab Potential  Good    PT Frequency  2x / week    PT Duration  8 weeks    PT Next Visit Plan  L shoulder stability and strength       Patient will benefit from skilled therapeutic intervention in order to improve the following deficits and impairments:  Impaired UE functional use, Decreased strength, Decreased range of motion, Increased muscle spasms, Postural dysfunction  Visit Diagnosis: Acute pain of left shoulder  Stiffness of left shoulder, not elsewhere classified  Cervicalgia     Problem List Patient Active Problem List   Diagnosis Date Noted  . Chronic left shoulder pain 01/13/2017  . Acute pain of left shoulder 01/11/2017  . Low HDL (under 40) 07/07/2015  . Allergic contact dermatitis 11/09/2014  . Chronic constipation 11/09/2014  . History of gonorrhea 11/09/2014  . History of giardia infection 11/09/2014  . HIV disease ([place]) 08/06/2014  . Asthma, mild intermittent 08/06/2014  . Seasonal allergic rhinitis 08/31/2006    Scot Jun,  PTA 02/06/2017, 9:50 AM  [place], [place], [address] Phone: [phone]   Fax:  [phone]  Name: David Mendez MRN: [id number] Date of Birth: [birth year]

## 2017-02-08 ENCOUNTER — Ambulatory Visit: Payer: 59 | Admitting: Physical Therapy

## 2017-02-10 MED FILL — TRIUMEQ 600-50-300 MG TABS: 600-50-300 | 30 days supply | Qty: 30 | Fill #1

## 2017-02-15 ENCOUNTER — Ambulatory Visit: Payer: 59 | Admitting: Physical Therapy

## 2017-02-15 ENCOUNTER — Encounter: Payer: Self-pay | Admitting: Physical Therapy

## 2017-02-15 DIAGNOSIS — M542 Cervicalgia: Secondary | ICD-10-CM | POA: Diagnosis not present

## 2017-02-15 DIAGNOSIS — M25612 Stiffness of left shoulder, not elsewhere classified: Secondary | ICD-10-CM

## 2017-02-15 DIAGNOSIS — M25512 Pain in left shoulder: Secondary | ICD-10-CM

## 2017-02-15 NOTE — Therapy (Signed)
[facility] [address] [place], [place], [address] Phone: [phone]   Fax:  [phone]  Physical Therapy Treatment  Patient Details  Name: David Mendez MRN: [id number] Date of Birth: 03/08/1996 Referring Provider: NM Xu   Encounter Date: 02/15/2017  PT End of Session - 02/15/17 1422    Visit Number  5    Date for PT Re-Evaluation  03/27/17    PT Start Time  1345    PT Stop Time  1440    PT Time Calculation (min)  55 min       Past Medical History:  Diagnosis Date  . Acute pain of left shoulder 01/11/2017  . Allergy   . Asthma   . Asthma, chronic 08/06/2014  . Chlamydia 06/08/2015  . History of giardia infection   . HIV antibody positive (HCC)   . HIV disease (HCC) 08/06/2014  . Industrial dermatitis   . Loss of weight     Past Surgical History:  Procedure Laterality Date  . WISDOM TOOTH EXTRACTION      There were no vitals filed for this visit.  Subjective Assessment - 02/15/17 1353    Subjective  Pt reports that he feels BLAH. Pt stated that he missed last appointment because he woke up in pain took a pill then overslept missing appointment. Pt stated that he has had L shoulder pain on and off since.    Currently in Pain?  Yes    Pain Score  3     Pain Orientation  Left                      [facility] Adult PT Treatment/Exercise - 02/15/17 0001      Shoulder Exercises: Standing   Flexion  10 reps;Both;Weights x2    Shoulder Flexion Weight (lbs)  2    ABduction  10 reps;Weights;Both 2    Shoulder ABduction Weight (lbs)  2    Extension  Theraband;Both;20 reps er    Theraband Level (Shoulder Extension)  Level 2 (Red)    Row  Theraband;Both;20 reps ER at end     Theraband Level (Shoulder Row)  Level 2 (Red)    Other Standing Exercises  OHP 3lb with cane 2x15     Other Standing Exercises  LUE IR/ER with approximation 2x5 flex and abd      Moist Heat Therapy   Number Minutes Moist  Heat  15 Minutes    Moist Heat Location  Shoulder      Electrical Stimulation   Electrical Stimulation Location  left clavicle area    Electrical Stimulation Action  IFC    Electrical Stimulation Parameters  sitting    Electrical Stimulation Goals  Pain               PT Short Term Goals - 01/30/17 1010      PT SHORT TERM GOAL #1   Title  independent with initial HEP    Status  Achieved        PT Long Term Goals - 02/01/17 0959      PT LONG TERM GOAL #1   Title  decrease pain 50%    Status  Partially Met            Plan - 02/15/17 1424    Clinical Impression Statement  Pt able to complete all of today's exercises well. He does report some L shoulder tightness. Pt has full PROM noted   with MT.    Rehab Potential  Good    PT Frequency  2x / week    PT Duration  8 weeks    PT Treatment/Interventions  ADLs/Self Care Home Management;Cryotherapy;Electrical Stimulation;Iontophoresis 4mg/ml Dexamethasone;Moist Heat;Ultrasound;Therapeutic activities;Therapeutic exercise;Patient/family education;Manual techniques;Taping;Vasopneumatic Device    PT Next Visit Plan  L shoulder stability and strenght       Patient will benefit from skilled therapeutic intervention in order to improve the following deficits and impairments:  Impaired UE functional use, Decreased strength, Decreased range of motion, Increased muscle spasms, Postural dysfunction  Visit Diagnosis: Stiffness of left shoulder, not elsewhere classified  Acute pain of left shoulder  Cervicalgia     Problem List Patient Active Problem List   Diagnosis Date Noted  . Chronic left shoulder pain 01/13/2017  . Acute pain of left shoulder 01/11/2017  . Low HDL (under 40) 07/07/2015  . Allergic contact dermatitis 11/09/2014  . Chronic constipation 11/09/2014  . History of gonorrhea 11/09/2014  . History of giardia infection 11/09/2014  . HIV disease (HCC) 08/06/2014  . Asthma, mild intermittent 08/06/2014   . Seasonal allergic rhinitis 08/31/2006    Ronald G Pemberton, PTA 02/15/2017, 2:25 PM  [facility] [address] [place], [place], [address] Phone: [phone]   Fax:  [phone]  Name: Lionel T Boeh MRN: [id number] Date of Birth: 09/21/1995   

## 2017-02-20 ENCOUNTER — Telehealth (INDEPENDENT_AMBULATORY_CARE_PROVIDER_SITE_OTHER): Payer: Self-pay | Admitting: Orthopaedic Surgery

## 2017-02-20 ENCOUNTER — Encounter: Payer: Self-pay | Admitting: Physical Therapy

## 2017-02-20 ENCOUNTER — Ambulatory Visit: Payer: 59 | Attending: Orthopaedic Surgery | Admitting: Physical Therapy

## 2017-02-20 DIAGNOSIS — M25512 Pain in left shoulder: Secondary | ICD-10-CM

## 2017-02-20 DIAGNOSIS — M25612 Stiffness of left shoulder, not elsewhere classified: Secondary | ICD-10-CM | POA: Diagnosis not present

## 2017-02-20 DIAGNOSIS — M542 Cervicalgia: Secondary | ICD-10-CM | POA: Diagnosis present

## 2017-02-20 NOTE — Telephone Encounter (Signed)
Patient requesting for extension of short term disability. If ok, how much longer so I can create updated work note. Thanks.

## 2017-02-20 NOTE — Telephone Encounter (Signed)
Can you please help with this? Dr Roda ShuttersXu ok'd for extension of STD for 4 more weeks.

## 2017-02-20 NOTE — Telephone Encounter (Signed)
Patient called asked if his S/T disability can be extended before he return to work. Patient advised his S/T disability papers were not signed. Patient advised the return to work note was not signed as well. The number to contact patient is [phone]

## 2017-02-20 NOTE — Telephone Encounter (Signed)
Ok for4 weeks

## 2017-02-20 NOTE — Therapy (Signed)
[place], [place], [address] Phone: [phone]   Fax:  [phone]  Physical Therapy Treatment  Patient Details  Name: PIERRE DELLAROCCO MRN: [id number] Date of Birth: 1995/06/28 Referring Provider: Phineas Real   Encounter Date: 02/20/2017  PT End of Session - 02/20/17 1606    Visit Number  6    Date for PT Re-Evaluation  03/27/17    PT Start Time  1527    PT Stop Time  1621    PT Time Calculation (min)  54 min    Activity Tolerance  Patient tolerated treatment well    Behavior During Therapy  [facility] for tasks assessed/performed       Past Medical History:  Diagnosis Date  . Acute pain of left shoulder 01/11/2017  . Allergy   . Asthma   . Asthma, chronic 08/06/2014  . Chlamydia 06/08/2015  . History of giardia infection   . HIV antibody positive ([place])   . HIV disease ([place]) 08/06/2014  . Industrial dermatitis   . Loss of weight     Past Surgical History:  Procedure Laterality Date  . WISDOM TOOTH EXTRACTION      There were no vitals filed for this visit.  Subjective Assessment - 02/20/17 1529    Subjective  Pt reports that he was suppose to go back to work tonight. Pt reports that his L shoulder has been hurting over the past few days.     Currently in Pain?  Yes    Pain Score  3     Pain Location  Shoulder    Pain Orientation  Left         [facility] PT Assessment - 02/20/17 0001      AROM   AROM Assessment Site  Shoulder standing     Right/Left Shoulder  Left    Left Shoulder Flexion  176 Degrees    Left Shoulder ABduction  180 Degrees    Left Shoulder Internal Rotation  40 Degrees    Left Shoulder External Rotation  87 Degrees                  [facility] Adult PT Treatment/Exercise - 02/20/17 0001      Shoulder Exercises: Seated   Other Seated Exercises  Rows & Lats 25lb 2x15       Shoulder Exercises: Standing   External Rotation  10 reps;Strengthening;Weights;Both x2     Theraband Level (Shoulder External Rotation)  Level 2 (Red)    Internal Rotation  Left;10 reps;Weights x2    Theraband Level (Shoulder Internal Rotation)  Level 2 (Red)    Extension  Theraband;Both;15 reps x2    Theraband Level (Shoulder Extension)  Level 2 (Red)    Row  Theraband;Both;15 reps x2    Theraband Level (Shoulder Row)  Level 2 (Red)    Other Standing Exercises  3 level cabinet reaches 2lb 2x10 flex & abd    Other Standing Exercises  LUE IR/ER with approximation 2x5 flex and abd      Shoulder Exercises: ROM/Strengthening   UBE (Upper Arm Bike)  L1.5 21fd/3rev      Moist Heat Therapy   Number Minutes Moist Heat  15 Minutes    Moist Heat Location  Shoulder      Electrical Stimulation   Electrical Stimulation Location  left clavicle area    Electrical Stimulation Action  IFC    Electrical Stimulation Parameters  sitting  Electrical Stimulation Goals  Pain      Manual Therapy   Manual Therapy  Passive ROM    Manual therapy comments  PROM WNL does report a pull in th eanterior L shoulder at end range    Passive ROM  L shoulder all directions               PT Short Term Goals - 01/30/17 1010      PT SHORT TERM GOAL #1   Title  independent with initial HEP    Status  Achieved        PT Long Term Goals - 02/20/17 1609      PT LONG TERM GOAL #2   Title  increase left shoulder ROM to WFL's    Status  Partially Met      PT LONG TERM GOAL #3   Title  increase ROM of the cervical spine to WFL's    Status  Achieved      PT LONG TERM GOAL #4   Title  understand proper posture and body mechanics    Status  Partially Met            Plan - 02/20/17 1607    Clinical Impression Statement  Pt reports on and off shoulder pain over the last few days. All exercises completed well, he does report some pain with 3 level cabinet reaches with abduction. Pt AROM is well but is limited with L shoulder ER. Pt has full L shoulder PROM, initially IR was tight but  after a few jt mobs IR improved.    Rehab Potential  Good    PT Frequency  2x / week    PT Duration  8 weeks    PT Next Visit Plan  L shoulder stability and strength       Patient will benefit from skilled therapeutic intervention in order to improve the following deficits and impairments:  Impaired UE functional use, Decreased strength, Decreased range of motion, Increased muscle spasms, Postural dysfunction  Visit Diagnosis: Stiffness of left shoulder, not elsewhere classified  Acute pain of left shoulder  Cervicalgia     Problem List Patient Active Problem List   Diagnosis Date Noted  . Chronic left shoulder pain 01/13/2017  . Acute pain of left shoulder 01/11/2017  . Low HDL (under 40) 07/07/2015  . Allergic contact dermatitis 11/09/2014  . Chronic constipation 11/09/2014  . History of gonorrhea 11/09/2014  . History of giardia infection 11/09/2014  . HIV disease ([place]) 08/06/2014  . Asthma, mild intermittent 08/06/2014  . Seasonal allergic rhinitis 08/31/2006    Scot Jun, PTA 02/20/2017, 4:09 PM  [place], [place], [address] Phone: [phone]   Fax:  [phone]  Name: DETAVIOUS RINN MRN: [id number] Date of Birth: [birth year]

## 2017-02-21 ENCOUNTER — Encounter (INDEPENDENT_AMBULATORY_CARE_PROVIDER_SITE_OTHER): Payer: Self-pay | Admitting: Orthopaedic Surgery

## 2017-02-21 ENCOUNTER — Ambulatory Visit (INDEPENDENT_AMBULATORY_CARE_PROVIDER_SITE_OTHER): Payer: 59 | Admitting: Orthopaedic Surgery

## 2017-02-21 DIAGNOSIS — G8929 Other chronic pain: Secondary | ICD-10-CM

## 2017-02-21 DIAGNOSIS — M25512 Pain in left shoulder: Secondary | ICD-10-CM

## 2017-02-21 NOTE — Progress Notes (Signed)
   Office Visit Note   Patient: Delsa GranaJaylen T Niemeier           Date of Birth: 13-Apr-1995           MRN: [id number] Visit Date: 02/21/2017              Requested by: Alba CorySowles, Krichna, MD [address] [place], [place] [address] PCP: Alba CorySowles, Krichna, MD   Assessment & Plan: Visit Diagnoses:  1. Chronic left shoulder pain     Plan: Impression is left shoulder pain and weakness.  Recommend MRI of the left shoulder and scapula to rule out structural abnormalities.  Patient has weakness with rotator may be related to some sort of compressive neuropathy.  Follow-Up Instructions: Return in about 2 weeks (around 03/07/2017).   Orders:  Orders Placed This Encounter  Procedures  . MR Shoulder Left w/o contrast   No orders of the defined types were placed in this encounter.     Procedures: No procedures performed   Clinical Data: No additional findings.   Subjective: Chief Complaint  Patient presents with  . Left Shoulder - Follow-up    Patient follows up today for continued left shoulder pain.  He states he has weakness and pain that radiates into his scapular region.  He does have weakness of his rotator cuff muscles.    Review of Systems   Objective: Vital Signs: There were no vitals taken for this visit.  Physical Exam  Ortho Exam Shoulder exam shows normal passive range of motion.  Rotator cuff is mildly weak with testing. Specialty Comments:.   No specialty comments available.  Imaging: No results found.   PMFS History: Patient Active Problem List   Diagnosis Date Noted  . Chronic left shoulder pain 01/13/2017  . Acute pain of left shoulder 01/11/2017  . Low HDL (under 40) 07/07/2015  . Allergic contact dermatitis 11/09/2014  . Chronic constipation 11/09/2014  . History of gonorrhea 11/09/2014  . History of giardia infection 11/09/2014  . HIV disease (HCC) 08/06/2014  . Asthma, mild intermittent 08/06/2014  . Seasonal allergic rhinitis  08/31/2006   Past Medical History:  Diagnosis Date  . Acute pain of left shoulder 01/11/2017  . Allergy   . Asthma   . Asthma, chronic 08/06/2014  . Chlamydia 06/08/2015  . History of giardia infection   . HIV antibody positive (HCC)   . HIV disease (HCC) 08/06/2014  . Industrial dermatitis   . Loss of weight     Family History  Problem Relation Age of Onset  . Asthma Mother   . Allergic rhinitis Mother   . Diabetes Mother   . Asthma Brother        Younger Brother    Past Surgical History:  Procedure Laterality Date  . WISDOM TOOTH EXTRACTION     Social History   Occupational History  . Not on file  Tobacco Use  . Smoking status: Never Smoker  . Smokeless tobacco: Never Used  Substance and Sexual Activity  . Alcohol use: Yes    Alcohol/week: 0.0 oz    Comment: very seldom, but binges sometimes  . Drug use: No  . Sexual activity: Yes    Partners: Male    Birth control/protection: Condom

## 2017-02-22 ENCOUNTER — Ambulatory Visit: Payer: 59 | Admitting: Physical Therapy

## 2017-02-22 ENCOUNTER — Encounter: Payer: Self-pay | Admitting: Physical Therapy

## 2017-02-22 DIAGNOSIS — M25512 Pain in left shoulder: Secondary | ICD-10-CM

## 2017-02-22 DIAGNOSIS — M542 Cervicalgia: Secondary | ICD-10-CM

## 2017-02-22 DIAGNOSIS — M25612 Stiffness of left shoulder, not elsewhere classified: Secondary | ICD-10-CM | POA: Diagnosis not present

## 2017-02-22 NOTE — Therapy (Signed)
[place], [place], [address] Phone: [phone]   Fax:  [phone]  Physical Therapy Treatment  Patient Details  Name: David Mendez MRN: [id number] Date of Birth: Nov 27, 1995 Referring Provider: Phineas Real   Encounter Date: 02/22/2017  PT End of Session - 02/22/17 1608    Visit Number  7    Date for PT Re-Evaluation  03/27/17    PT Start Time  9150    PT Stop Time  1614    PT Time Calculation (min)  59 min    Activity Tolerance  Patient tolerated treatment well    Behavior During Therapy  [facility] for tasks assessed/performed       Past Medical History:  Diagnosis Date  . Acute pain of left shoulder 01/11/2017  . Allergy   . Asthma   . Asthma, chronic 08/06/2014  . Chlamydia 06/08/2015  . History of giardia infection   . HIV antibody positive ([place])   . HIV disease ([place]) 08/06/2014  . Industrial dermatitis   . Loss of weight     Past Surgical History:  Procedure Laterality Date  . WISDOM TOOTH EXTRACTION      There were no vitals filed for this visit.  Subjective Assessment - 02/22/17 1515    Subjective  Pt reports going to the MD yesterday, MD want his to have an MIR taken of his L shoulder due to him having a knot in the back of is L shoulder. Pt stated the MD wanted to make sure it was nit a cyst.    Currently in Pain?  No/denies    Pain Score  0-No pain                      [facility] Adult PT Treatment/Exercise - 02/22/17 0001      Shoulder Exercises: Seated   Other Seated Exercises  Rows & Lats 25lb 2x15     Other Seated Exercises  Chest press with serratus 15lb 2x10      Shoulder Exercises: Standing   Flexion  10 reps;Both;Weights    Shoulder Flexion Weight (lbs)  3    Extension  Theraband;Both;15 reps x2    Theraband Level (Shoulder Extension)  Level 3 (Green)    Other Standing Exercises  3 level cabinet reaches 2lb 2x10 flex & abd; 4 way posterior shoulder stabe  x10 each 2lb     Other Standing Exercises  LUE IR/ER with approximation 2x5 flex and abd; wall push ups 2x10       Shoulder Exercises: ROM/Strengthening   UBE (Upper Arm Bike)  L4 25fd/3rev      Moist Heat Therapy   Number Minutes Moist Heat  15 Minutes    Moist Heat Location  Shoulder      Electrical Stimulation   Electrical Stimulation Location  posterior L shoulder    Electrical Stimulation Action  Pre mod    Electrical Stimulation Parameters  sitting    Electrical Stimulation Goals  Pain               PT Short Term Goals - 01/30/17 1010      PT SHORT TERM GOAL #1   Title  independent with initial HEP    Status  Achieved        PT Long Term Goals - 02/20/17 1609      PT LONG TERM GOAL #2   Title  increase left shoulder ROM to  WFL's    Status  Partially Met      PT LONG TERM GOAL #3   Title  increase ROM of the cervical spine to WFL's    Status  Achieved      PT LONG TERM GOAL #4   Title  understand proper posture and body mechanics    Status  Partially Met            Plan - 02/22/17 1609    Clinical Impression Statement  Pt reports no issues with today exercises only muscle fatigue and tightness. Pt reports that he does have trouble sleeping on L side. stated most difficulty with IR/ER with approximation.    Rehab Potential  Good    PT Frequency  2x / week    PT Duration  8 weeks    PT Treatment/Interventions  ADLs/Self Care Home Management;Cryotherapy;Electrical Stimulation;Iontophoresis 71m/ml Dexamethasone;Moist Heat;Ultrasound;Therapeutic activities;Therapeutic exercise;Patient/family education;Manual techniques;Taping;Vasopneumatic Device    PT Next Visit Plan  L shoulder stability and strenght       Patient will benefit from skilled therapeutic intervention in order to improve the following deficits and impairments:  Impaired UE functional use, Decreased strength, Decreased range of motion, Increased muscle spasms, Postural  dysfunction  Visit Diagnosis: Stiffness of left shoulder, not elsewhere classified  Cervicalgia  Acute pain of left shoulder     Problem List Patient Active Problem List   Diagnosis Date Noted  . Chronic left shoulder pain 01/13/2017  . Acute pain of left shoulder 01/11/2017  . Low HDL (under 40) 07/07/2015  . Allergic contact dermatitis 11/09/2014  . Chronic constipation 11/09/2014  . History of gonorrhea 11/09/2014  . History of giardia infection 11/09/2014  . HIV disease ([place] 08/06/2014  . Asthma, mild intermittent 08/06/2014  . Seasonal allergic rhinitis 08/31/2006    RScot Jun PTA 02/22/2017, 4:10 PM  [facility] [address]Phone: [phone]  Fax:  [phone] Name: David DOLLENSMRN: [id number] of Birth: 110/26/1997

## 2017-02-23 ENCOUNTER — Telehealth (INDEPENDENT_AMBULATORY_CARE_PROVIDER_SITE_OTHER): Payer: Self-pay

## 2017-02-23 ENCOUNTER — Telehealth (INDEPENDENT_AMBULATORY_CARE_PROVIDER_SITE_OTHER): Payer: Self-pay | Admitting: Orthopaedic Surgery

## 2017-02-23 NOTE — Telephone Encounter (Signed)
Patient called asking if Dr. Roda ShuttersXu had extended his leave of absence from work. CB # [phone]

## 2017-02-23 NOTE — Telephone Encounter (Signed)
Patient called and [facility] States he dropped off paperwork. Forms on Monday. I advised him it can take up to 7-10 business days for this to be completed.  just an [place]

## 2017-02-23 NOTE — Telephone Encounter (Signed)
Called patient back and he states his job took him out of work since they couldn't accommodate no use of left arm. Needs a note for this out of work for ____days/weeks?  Please advise.

## 2017-02-23 NOTE — Telephone Encounter (Signed)
David Mendez, I do not see a letter stating no work, only restrictions on arm.

## 2017-02-23 NOTE — Telephone Encounter (Signed)
Until follow up appointment

## 2017-02-23 NOTE — Telephone Encounter (Signed)
That is correct,  only restrictions to the left arm

## 2017-02-23 NOTE — Telephone Encounter (Signed)
Note sure what was said on appt. I was not here that day. I do see a letter made 02/21/17.  "David Mendez was seen in my clinic on 02/21/2017. He can not use left arm until his next follow up appointment with me in the office."  See message below.  Please advise.

## 2017-02-24 ENCOUNTER — Encounter (INDEPENDENT_AMBULATORY_CARE_PROVIDER_SITE_OTHER): Payer: Self-pay

## 2017-02-24 NOTE — Telephone Encounter (Signed)
Note made. Sent msg to Keane Scrapeirsa so that she can send all note to [employer]

## 2017-02-27 ENCOUNTER — Ambulatory Visit: Payer: 59 | Admitting: Physical Therapy

## 2017-03-01 ENCOUNTER — Ambulatory Visit: Payer: 59 | Admitting: Physical Therapy

## 2017-03-01 ENCOUNTER — Ambulatory Visit
Admission: RE | Admit: 2017-03-01 | Discharge: 2017-03-01 | Disposition: A | Discharge status: Home / Self Care | Payer: 59 | Source: Ambulatory Visit | Attending: Specialist | Admitting: Specialist

## 2017-03-01 DIAGNOSIS — G8929 Other chronic pain: Secondary | ICD-10-CM

## 2017-03-01 DIAGNOSIS — M25512 Pain in left shoulder: Principal | ICD-10-CM

## 2017-03-02 ENCOUNTER — Encounter (INDEPENDENT_AMBULATORY_CARE_PROVIDER_SITE_OTHER): Payer: Self-pay | Admitting: Orthopaedic Surgery

## 2017-03-02 ENCOUNTER — Ambulatory Visit (INDEPENDENT_AMBULATORY_CARE_PROVIDER_SITE_OTHER): Payer: 59 | Admitting: Orthopaedic Surgery

## 2017-03-02 DIAGNOSIS — G8929 Other chronic pain: Secondary | ICD-10-CM

## 2017-03-02 DIAGNOSIS — M25512 Pain in left shoulder: Secondary | ICD-10-CM

## 2017-03-02 NOTE — Progress Notes (Signed)
Office Visit Note   Patient: David Mendez           Date of Birth: 1995-05-16           MRN: [id number] Visit Date: 03/02/2017              Requested by: Alba CorySowles, Krichna, MD [address] [place], [place] [address] PCP: Alba CorySowles, Krichna, MD   Assessment & Plan: Visit Diagnoses:  1. Chronic left shoulder pain     Plan: MRI findings are consistent mainly with adhesive capsulitis.  He does have mild tendinosis of the rotator cuff without any focal tears.  I recommend a cortisone injection with continued physical therapy with repeat cortisone injection 6 weeks after the first 1.  Patient should follow-up if he is not better after completing the injections and physical therapy.  Otherwise he may return back to work.  He does date of injury  which I think is fine for  him to do.    Follow-Up Instructions: Return if symptoms worsen or fail to improve.   Orders:  No orders of the defined types were placed in this encounter.  No orders of the defined types were placed in this encounter.     Procedures: No procedures performed   Clinical Data: No additional findings.   Subjective: No chief complaint on file.   Patient is a 21 year old gentleman who follows up for his left shoulder MRI.    Review of Systems   Objective: Vital Signs: There were no vitals taken for this visit.  Physical Exam  Ortho Exam Left shoulder exam is stable.  MRI findings are more consistent with Specialty Comments:  No specialty comments available.  Imaging: Mr Shoulder Left W/o Contrast  Result Date: 03/02/2017 CLINICAL DATA:  Left shoulder pain for 6 months. EXAM: MRI OF THE LEFT SHOULDER WITHOUT CONTRAST TECHNIQUE: Multiplanar, multisequence MR imaging of the shoulder was performed. No intravenous contrast was administered. COMPARISON:  Radiographs 09/13/2016 FINDINGS: Rotator cuff: Moderate supraspinatus tendinopathy/ tendinosis at the anterior attachment site. Probable  interstitial tears but no partial or full-thickness tear. The infraspinatus and subscapularis tendons are intact. Muscles:  Normal Biceps long head:  Intact Acromioclavicular Joint: No degenerative changes. Type 1-2 acromion. No lateral downsloping or undersurface spurring. Glenohumeral Joint: No degenerative changes or joint effusion. There is thickening of the capsular structures in the axillary recess which can be seen with adhesive capsulitis or synovitis. Labrum:  Intact Bones: No acute bony findings. Minimal subchondral cystic type change involving the posterior aspect of the humeral head near the infraspinatus attachment site. Other: No subacromial/subdeltoid fluid collections to suggest bursitis. IMPRESSION: 1. Supraspinatus tendinopathy/tendinosis with probable interstitial tears but no partial or full-thickness tear. 2. There is thickening of the capsular structures in the axillary recess which can be seen with adhesive capsulitis or synovitis. 3. Intact long head biceps tendon and glenoid labrum. 4. No findings for bony impingement. Electronically Signed   By: Rudie MeyerP.  Gallerani M.D.   On: 03/02/2017 08:26     PMFS History: Patient Active Problem List   Diagnosis Date Noted  . Chronic left shoulder pain 01/13/2017  . Acute pain of left shoulder 01/11/2017  . Low HDL (under 40) 07/07/2015  . Allergic contact dermatitis 11/09/2014  . Chronic constipation 11/09/2014  . History of gonorrhea 11/09/2014  . History of giardia infection 11/09/2014  . HIV disease (HCC) 08/06/2014  . Asthma, mild intermittent 08/06/2014  . Seasonal allergic rhinitis 08/31/2006   Past Medical History:  Diagnosis Date  . Acute pain of left shoulder 01/11/2017  . Allergy   . Asthma   . Asthma, chronic 08/06/2014  . Chlamydia 06/08/2015  . History of giardia infection   . HIV antibody positive (HCC)   . HIV disease (HCC) 08/06/2014  . Industrial dermatitis   . Loss of weight     Family History  Problem Relation  Age of Onset  . Asthma Mother   . Allergic rhinitis Mother   . Diabetes Mother   . Asthma Brother        Younger Brother    Past Surgical History:  Procedure Laterality Date  . WISDOM TOOTH EXTRACTION     Social History   Occupational History  . Not on file  Tobacco Use  . Smoking status: Never Smoker  . Smokeless tobacco: Never Used  Substance and Sexual Activity  . Alcohol use: Yes    Alcohol/week: 0.0 oz    Comment: very seldom, but binges sometimes  . Drug use: No  . Sexual activity: Yes    Partners: Male    Birth control/protection: Condom

## 2017-03-06 ENCOUNTER — Encounter: Payer: Self-pay | Admitting: Physical Therapy

## 2017-03-06 ENCOUNTER — Other Ambulatory Visit: Payer: Self-pay | Admitting: Pharmacist

## 2017-03-06 ENCOUNTER — Ambulatory Visit: Payer: 59 | Admitting: Physical Therapy

## 2017-03-06 DIAGNOSIS — M25512 Pain in left shoulder: Secondary | ICD-10-CM

## 2017-03-06 DIAGNOSIS — M25612 Stiffness of left shoulder, not elsewhere classified: Secondary | ICD-10-CM | POA: Diagnosis not present

## 2017-03-06 DIAGNOSIS — M542 Cervicalgia: Secondary | ICD-10-CM

## 2017-03-06 NOTE — Therapy (Signed)
[place], [place], [address] Phone: [phone]   Fax:  [phone]  Physical Therapy Treatment  Patient Details  Name: David Mendez MRN: [id number] Date of Birth: September 16, 1995 Referring Provider: Phineas Real   Encounter Date: 03/06/2017  PT End of Session - 03/06/17 1226    Visit Number  8    Date for PT Re-Evaluation  03/27/17    PT Start Time  1751    PT Stop Time  1240    PT Time Calculation (min)  55 min       Past Medical History:  Diagnosis Date  . Acute pain of left shoulder 01/11/2017  . Allergy   . Asthma   . Asthma, chronic 08/06/2014  . Chlamydia 06/08/2015  . History of giardia infection   . HIV antibody positive ([place])   . HIV disease ([place]) 08/06/2014  . Industrial dermatitis   . Loss of weight     Past Surgical History:  Procedure Laterality Date  . WISDOM TOOTH EXTRACTION      There were no vitals filed for this visit.  Subjective Assessment - 03/06/17 1149    Subjective  Pt reports that's he had his MRI don and was diagnosed with a frozen shoulder. MD wants him to have an injection,     Currently in Pain?  No/denies    Pain Score  0-No pain                      [facility] Adult PT Treatment/Exercise - 03/06/17 0001      Shoulder Exercises: Seated   Other Seated Exercises  Rows & Lats 35lb 2x15     Other Seated Exercises  Chest press with serratus 15lb 2x10; 5lb bent over rows & ext 2x10       Shoulder Exercises: Standing   External Rotation  Strengthening;Weights;Both;15 reps x2    Theraband Level (Shoulder External Rotation)  Level 3 (Green)    Other Standing Exercises  3 level cabinet reaches 2lb 2x10 flex & abd; 4 way posterior shoulder stabe x10 each 2lb       Shoulder Exercises: ROM/Strengthening   UBE (Upper Arm Bike)  L4 56fd/3rev      Moist Heat Therapy   Number Minutes Moist Heat  15 Minutes    Moist Heat Location  Shoulder      Electrical Stimulation   Electrical Stimulation Location  posterior L shoulder    Electrical Stimulation Action  pre mod    Electrical Stimulation Parameters  sitting    Electrical Stimulation Goals  Pain               PT Short Term Goals - 01/30/17 1010      PT SHORT TERM GOAL #1   Title  independent with initial HEP    Status  Achieved        PT Long Term Goals - 02/20/17 1609      PT LONG TERM GOAL #2   Title  increase left shoulder ROM to WFL's    Status  Partially Met      PT LONG TERM GOAL #3   Title  increase ROM of the cervical spine to WFL's    Status  Achieved      PT LONG TERM GOAL #4   Title  understand proper posture and body mechanics    Status  Partially Met  Plan - 03/06/17 1229    Clinical Impression Statement  Pt tolerated today's treatment well, he reports no pain just he can feel it. Good strengthen and ROM with all exercises.    Rehab Potential  Good    PT Frequency  2x / week    PT Duration  8 weeks    PT Treatment/Interventions  ADLs/Self Care Home Management;Cryotherapy;Electrical Stimulation;Iontophoresis 24m/ml Dexamethasone;Moist Heat;Ultrasound;Therapeutic activities;Therapeutic exercise;Patient/family education;Manual techniques;Taping;Vasopneumatic Device    PT Next Visit Plan  L shoulder stability and strenght       Patient will benefit from skilled therapeutic intervention in order to improve the following deficits and impairments:  Impaired UE functional use, Decreased strength, Decreased range of motion, Increased muscle spasms, Postural dysfunction  Visit Diagnosis: Stiffness of left shoulder, not elsewhere classified  Cervicalgia  Acute pain of left shoulder     Problem List Patient Active Problem List   Diagnosis Date Noted  . Chronic left shoulder pain 01/13/2017  . Acute pain of left shoulder 01/11/2017  . Low HDL (under 40) 07/07/2015  . Allergic contact dermatitis 11/09/2014  . Chronic  constipation 11/09/2014  . History of gonorrhea 11/09/2014  . History of giardia infection 11/09/2014  . HIV disease ([place] 08/06/2014  . Asthma, mild intermittent 08/06/2014  . Seasonal allergic rhinitis 08/31/2006    RScot Jun PTA 03/06/2017, 12:30 PM  [place] [address]Phone: [phone]  Fax:  [phone] Name: David SKODAMRN: [id number] of Birth: 101/18/1997

## 2017-03-08 ENCOUNTER — Encounter: Payer: Self-pay | Admitting: Physical Therapy

## 2017-03-08 ENCOUNTER — Ambulatory Visit: Payer: 59 | Admitting: Physical Therapy

## 2017-03-08 DIAGNOSIS — M25612 Stiffness of left shoulder, not elsewhere classified: Secondary | ICD-10-CM

## 2017-03-08 DIAGNOSIS — M542 Cervicalgia: Secondary | ICD-10-CM

## 2017-03-08 DIAGNOSIS — M25512 Pain in left shoulder: Secondary | ICD-10-CM

## 2017-03-08 NOTE — Therapy (Signed)
[place], [place], [address] Phone: [phone]   Fax:  [phone]  Physical Therapy Treatment  Patient Details  Name: David Mendez MRN: [id number] Date of Birth: 03/04/1996 Referring Provider: Phineas Real   Encounter Date: 03/08/2017  PT End of Session - 03/08/17 1225    Visit Number  9    Date for PT Re-Evaluation  03/27/17    PT Start Time  1146    PT Stop Time  1240    PT Time Calculation (min)  54 min       Past Medical History:  Diagnosis Date  . Acute pain of left shoulder 01/11/2017  . Allergy   . Asthma   . Asthma, chronic 08/06/2014  . Chlamydia 06/08/2015  . History of giardia infection   . HIV antibody positive ([place])   . HIV disease ([place]) 08/06/2014  . Industrial dermatitis   . Loss of weight     Past Surgical History:  Procedure Laterality Date  . WISDOM TOOTH EXTRACTION      There were no vitals filed for this visit.  Subjective Assessment - 03/08/17 1149    Subjective  Pt reports that he is little stiff because he slept on his shoulder    Currently in Pain?  No/denies    Pain Score  0-No pain                      [facility] Adult PT Treatment/Exercise - 03/08/17 0001      Shoulder Exercises: Seated   Other Seated Exercises  Rows & Lats 45lb 2x10     Other Seated Exercises  Chest press with serratus 15lb 2x10; 5lb bent over rows & ext 2x10; OHP 5lb 2x10       Shoulder Exercises: Prone   Other Prone Exercises  pus ups 3x5       Shoulder Exercises: Standing   Other Standing Exercises  3 level cabinet reaches 3lb x10 flex & abd; 4 way posterior shoulder stabe x10 each 3lb       Shoulder Exercises: ROM/Strengthening   UBE (Upper Arm Bike)  L4 56fd/3rev      Moist Heat Therapy   Number Minutes Moist Heat  15 Minutes    Moist Heat Location  Shoulder      Electrical Stimulation   Electrical Stimulation Location  posterior L shoulder    Electrical  Stimulation Action  pre mod    Electrical Stimulation Parameters  sitting    Electrical Stimulation Goals  Pain               PT Short Term Goals - 01/30/17 1010      PT SHORT TERM GOAL #1   Title  independent with initial HEP    Status  Achieved        PT Long Term Goals - 03/08/17 1225      PT LONG TERM GOAL #1   Title  decrease pain 50%    Status  Partially Met      PT LONG TERM GOAL #2   Title  increase left shoulder ROM to WFL's    Status  Partially Met      PT LONG TERM GOAL #3   Title  increase ROM of the cervical spine to WFL's    Status  Achieved      PT LONG TERM GOAL #4   Title  understand proper posture and body  mechanics    Status  Achieved            Plan - 03/08/17 1225    Clinical Impression Statement  Pt is progressing towards all goals, he reports no functional limitations at home. Reports some popping with 3 level cabinet reaches using 3lb weight. No issues with push ups.    Rehab Potential  Good    PT Frequency  2x / week    PT Duration  8 weeks    PT Next Visit Plan  L shoulder stability and strenght       Patient will benefit from skilled therapeutic intervention in order to improve the following deficits and impairments:  Impaired UE functional use, Decreased strength, Decreased range of motion, Increased muscle spasms, Postural dysfunction  Visit Diagnosis: Acute pain of left shoulder  Cervicalgia  Stiffness of left shoulder, not elsewhere classified     Problem List Patient Active Problem List   Diagnosis Date Noted  . Chronic left shoulder pain 01/13/2017  . Acute pain of left shoulder 01/11/2017  . Low HDL (under 40) 07/07/2015  . Allergic contact dermatitis 11/09/2014  . Chronic constipation 11/09/2014  . History of gonorrhea 11/09/2014  . History of giardia infection 11/09/2014  . HIV disease ([place]) 08/06/2014  . Asthma, mild intermittent 08/06/2014  . Seasonal allergic rhinitis 08/31/2006    Scot Jun, PTA 03/08/2017, 12:29 PM  [place] [address] [address] [place], [place], [address] Phone: [phone]   Fax:  [phone]  Name: David Mendez MRN: [id number] Date of Birth: 1995-12-29

## 2017-03-15 ENCOUNTER — Ambulatory Visit: Payer: 59 | Admitting: Physical Therapy

## 2017-03-16 ENCOUNTER — Ambulatory Visit: Payer: 59 | Admitting: Physical Therapy

## 2017-03-16 ENCOUNTER — Encounter: Payer: Self-pay | Admitting: Physical Therapy

## 2017-03-16 DIAGNOSIS — M25512 Pain in left shoulder: Secondary | ICD-10-CM

## 2017-03-16 DIAGNOSIS — M25612 Stiffness of left shoulder, not elsewhere classified: Secondary | ICD-10-CM

## 2017-03-16 DIAGNOSIS — M542 Cervicalgia: Secondary | ICD-10-CM

## 2017-03-16 NOTE — Therapy (Addendum)
[facility] [place], [place], [address] Phone: [phone]   Fax:  [phone]  Physical Therapy Treatment  Patient Details  Name: David Mendez MRN: [id number] Date of Birth: [birth year] Referring Provider: Phineas Real   Encounter Date: 03/16/2017  PT End of Session - 03/16/17 1222    Visit Number  10    Date for PT Re-Evaluation  03/27/17    PT Start Time  7209    PT Stop Time  1232    PT Time Calculation (min)  48 min    Activity Tolerance  Patient tolerated treatment well    Behavior During Therapy  [facility] for tasks assessed/performed       Past Medical History:  Diagnosis Date  . Acute pain of left shoulder 01/11/2017  . Allergy   . Asthma   . Asthma, chronic 08/06/2014  . Chlamydia 06/08/2015  . History of giardia infection   . HIV antibody positive ([place])   . HIV disease ([place]) 08/06/2014  . Industrial dermatitis   . Loss of weight     Past Surgical History:  Procedure Laterality Date  . WISDOM TOOTH EXTRACTION      There were no vitals filed for this visit.  Subjective Assessment - 03/16/17 1150    Subjective  pt reports that's his asthma was bothering him yesterday that is why he called out. Pt request to take it easy today    Currently in Pain?  No/denies    Pain Score  -- Just tightness         [facility] PT Assessment - 03/16/17 0001      AROM   Overall AROM Comments  cervical & L shoulder ROM WFL                  [facility] Adult PT Treatment/Exercise - 03/16/17 0001      Shoulder Exercises: Seated   Other Seated Exercises  bentover rows & ext 5lb 2x10       Shoulder Exercises: Standing   External Rotation  Strengthening;Weights;Both;10 reps x3    Theraband Level (Shoulder External Rotation)  Level 2 (Red)    Internal Rotation  Left;10 reps;Weights x3    Internal Rotation Weight (lbs)  10    Other Standing Exercises  3 level cabinet reaches 3lb x10 flex & abd; 4 way  posterior shoulder stabe x10 each 3lb     Other Standing Exercises  Overhead reaches 4lb x10 reprots a sharp ache posterior L shoulder      Shoulder Exercises: ROM/Strengthening   UBE (Upper Arm Bike)  L3 21fd/3rev      Moist Heat Therapy   Number Minutes Moist Heat  15 Minutes    Moist Heat Location  Shoulder      Electrical Stimulation   Electrical Stimulation Location  posterior L shoulder    Electrical Stimulation Action  IFC    Electrical Stimulation Parameters  supune    Electrical Stimulation Goals  Pain               PT Short Term Goals - 01/30/17 1010      PT SHORT TERM GOAL #1   Title  independent with initial HEP    Status  Achieved        PT Long Term Goals - 03/08/17 1225      PT LONG TERM GOAL #1   Title  decrease pain 50%    Status  Partially Met  PT LONG TERM GOAL #2   Title  increase left shoulder ROM to WFL's    Status  Partially Met      PT LONG TERM GOAL #3   Title  increase ROM of the cervical spine to WFL's    Status  Achieved      PT LONG TERM GOAL #4   Title  understand proper posture and body mechanics    Status  Achieved            Plan - 03/16/17 1222    Clinical Impression Statement  Pt has full L shoulder AROM. All exercises completed well. Pt reports no functional limitation other than L shoulder soreness with repetitive over head lifting.    Rehab Potential  Good    PT Frequency  2x / week    PT Duration  8 weeks    PT Treatment/Interventions  ADLs/Self Care Home Management;Cryotherapy;Electrical Stimulation;Iontophoresis 78m/ml Dexamethasone;Moist Heat;Ultrasound;Therapeutic activities;Therapeutic exercise;Patient/family education;Manual techniques;Taping;Vasopneumatic Device    PT Next Visit Plan  L shoulder stability and strength       Patient will benefit from skilled therapeutic intervention in order to improve the following deficits and impairments:  Impaired UE functional use, Decreased strength, Decreased  range of motion, Increased muscle spasms, Postural dysfunction  Visit Diagnosis: Cervicalgia  Acute pain of left shoulder  Stiffness of left shoulder, not elsewhere classified     Problem List Patient Active Problem List   Diagnosis Date Noted  . Chronic left shoulder pain 01/13/2017  . Acute pain of left shoulder 01/11/2017  . Low HDL (under 40) 07/07/2015  . Allergic contact dermatitis 11/09/2014  . Chronic constipation 11/09/2014  . History of gonorrhea 11/09/2014  . History of giardia infection 11/09/2014  . [place] disease [place] 08/06/2014  . Asthma, mild intermittent 08/06/2014  . Seasonal allergic rhinitis 08/31/2006   PHYSICAL THERAPY DISCHARGE SUMMARY  Visits from Start of Care: 10  Plan: Patient agrees to discharge.  Patient goals were partially met. Patient is being discharged due to being pleased with the current functional level.  ?????      RScot Jun PTA 03/16/2017, 12:23 PM  [address] [address]Phone: [phone]  Fax:  [phone] Name: David TALENTMRN: [id number] of Birth: [birth year]

## 2017-03-20 MED FILL — TRIUMEQ 600-50-300 MG TABS: 600-50-300 | 30 days supply | Qty: 30 | Fill #2

## 2017-03-22 ENCOUNTER — Ambulatory Visit (INDEPENDENT_AMBULATORY_CARE_PROVIDER_SITE_OTHER): Payer: 59 | Admitting: Physical Medicine and Rehabilitation

## 2017-03-31 ENCOUNTER — Ambulatory Visit (INDEPENDENT_AMBULATORY_CARE_PROVIDER_SITE_OTHER): Payer: 59 | Admitting: Physical Medicine and Rehabilitation

## 2017-04-03 ENCOUNTER — Telehealth (INDEPENDENT_AMBULATORY_CARE_PROVIDER_SITE_OTHER): Payer: Self-pay | Admitting: Physical Medicine and Rehabilitation

## 2017-04-03 NOTE — Telephone Encounter (Signed)
Rescheduled

## 2017-04-03 NOTE — Telephone Encounter (Signed)
Patient had an appointment on 1/11 and was late.  He is needing his appointment to be rescheduled.  CB#[phone].  Thank you..Marland Kitchen

## 2017-04-04 ENCOUNTER — Ambulatory Visit: Payer: 59 | Admitting: Family Medicine

## 2017-04-07 ENCOUNTER — Telehealth: Payer: Self-pay | Admitting: Pharmacist

## 2017-04-07 NOTE — Telephone Encounter (Signed)
Called [facility] to see how he is doing on his Triumeq.  He missed 2 doses when it snowed because he was stuck at his friend's house. Other than that, no issues and no missed doses.  He has a new address, so I updated it in epic and I will let [facility] know since they mail it to him.  He has a f/u scheduled in April for Dr. Daiva EvesVan Dam.

## 2017-04-14 ENCOUNTER — Encounter (INDEPENDENT_AMBULATORY_CARE_PROVIDER_SITE_OTHER): Payer: Self-pay | Admitting: Physical Medicine and Rehabilitation

## 2017-04-14 ENCOUNTER — Ambulatory Visit (INDEPENDENT_AMBULATORY_CARE_PROVIDER_SITE_OTHER): Payer: Self-pay

## 2017-04-14 ENCOUNTER — Ambulatory Visit (INDEPENDENT_AMBULATORY_CARE_PROVIDER_SITE_OTHER): Payer: 59 | Admitting: Physical Medicine and Rehabilitation

## 2017-04-14 DIAGNOSIS — M7502 Adhesive capsulitis of left shoulder: Secondary | ICD-10-CM

## 2017-04-14 DIAGNOSIS — G8929 Other chronic pain: Secondary | ICD-10-CM | POA: Diagnosis not present

## 2017-04-14 DIAGNOSIS — M25512 Pain in left shoulder: Secondary | ICD-10-CM | POA: Diagnosis not present

## 2017-04-14 DIAGNOSIS — B20 Human immunodeficiency virus [HIV] disease: Secondary | ICD-10-CM

## 2017-04-14 MED ORDER — BUPIVACAINE HCL 0.5 % IJ SOLN
3.0000 mL | INTRAMUSCULAR | Status: AC | PRN
  Administered 2017-04-14: 3 mL via INTRA_ARTICULAR

## 2017-04-14 MED ORDER — TRIAMCINOLONE ACETONIDE 40 MG/ML IJ SUSP
80.0000 mg | INTRAMUSCULAR | Status: AC | PRN
  Administered 2017-04-14: 80 mg via INTRA_ARTICULAR

## 2017-04-14 NOTE — Patient Instructions (Signed)

## 2017-04-14 NOTE — Progress Notes (Signed)
David Mendez - 22 y.o. male MRN [id number]  Date of birth: [birth year]  Office Visit Note: Visit Date: 04/14/2017 PCP: Alba Cory, MD Referred by: Alba Cory, MD  Subjective: Chief Complaint  Patient presents with  . Neck - Pain  . Left Shoulder - Pain, Tingling   HPI: David Mendez is a 22 year old gentleman followed by Dr. Roda Shutters who comes in today at his request for diagnostic and hopefully therapeutic anesthetic glenohumeral joint arthrogram on the left.  The patient has had MRI of the left shoulder showing likely or synovitis.  The patient has been in physical therapy and has increased some of his range of motion but still has a a lot of pain and decreased range of motion.    ROS Otherwise per HPI.  Assessment & Plan: Visit Diagnoses:  1. Chronic left shoulder pain   2. Adhesive capsulitis of left shoulder   3. HIV disease (HCC)     Plan: Findings:  Diagnostic and hopefully therapeutic anesthetic glenohumeral joint arthrogram on the left.  Patient did have relief of symptoms during the anesthetic phase and increased range of motion.    Meds & Orders: No orders of the defined types were placed in this encounter.   Orders Placed This Encounter  Procedures  . Large Joint Inj: L glenohumeral  . XR C-ARM NO REPORT    Follow-up: Return if symptoms worsen or fail to improve.   Procedures: Large Joint Inj: L glenohumeral on 04/14/2017 10:00 AM Indications: pain and diagnostic evaluation Details: 22 G 3.5 in needle, anteromedial approach  Arthrogram: Yes  Medications: 80 mg triamcinolone acetonide 40 MG/ML; 3 mL bupivacaine 0.5 %  Arthrogram demonstrated excellent flow of contrast throughout the joint surface without extravasation or obvious defect.  The patient had relief of symptoms during the anesthetic phase of the injection.  Procedure, treatment alternatives, risks and benefits explained, specific risks discussed. Consent was given by the patient. Immediately  prior to procedure a time out was called to verify the correct patient, procedure, equipment, support staff and site/side marked as required. Patient was prepped and draped in the usual sterile fashion.      No notes on file   Clinical History: Shoulder MRI IMPRESSION: 1. Supraspinatus tendinopathy/tendinosis with probable interstitial tears but no partial or full-thickness tear. 2. There is thickening of the capsular structures in the axillary recess which can be seen with adhesive capsulitis or synovitis. 3. Intact long head biceps tendon and glenoid labrum. 4. No findings for bony impingement.   Electronically Signed   By: Rudie Meyer M.D.   On: 03/02/2017 08:26  He reports that  has never smoked. he has never used smokeless tobacco. No results for input(s): HGBA1C, LABURIC in the last 8760 hours.  Objective:  VS:  HT:    WT:   BMI:     BP:   HR: bpm  TEMP: ( )  RESP:  Physical Exam  Ortho Exam Imaging: Xr C-arm No Report  Result Date: 04/14/2017 Please see Notes or Procedures tab for imaging impression.   Past Medical/Family/Surgical/Social History: Medications & Allergies reviewed per EMR Patient Active Problem List   Diagnosis Date Noted  . Chronic left shoulder pain 01/13/2017  . Acute pain of left shoulder 01/11/2017  . Low HDL (under 40) 07/07/2015  . Allergic contact dermatitis 11/09/2014  . Chronic constipation 11/09/2014  . History of gonorrhea 11/09/2014  . History of giardia infection 11/09/2014  . HIV disease (HCC) 08/06/2014  . Asthma, mild  intermittent 08/06/2014  . Seasonal allergic rhinitis 08/31/2006   Past Medical History:  Diagnosis Date  . Acute pain of left shoulder 01/11/2017  . Allergy   . Asthma   . Asthma, chronic 08/06/2014  . Chlamydia 06/08/2015  . History of giardia infection   . HIV antibody positive (HCC)   . HIV disease (H[facility]) 08/06/2014  . Industrial dermatitis   . Loss of weight    Family History  Problem Relation  Age of Onset  . Asthma Mother   . Allergic rhinitis Mother   . Diabetes Mother   . Asthma Brother        Younger Brother   Past Surgical History:  Procedure Laterality Date  . WISDOM TOOTH EXTRACTION     Social History   Occupational History  . Not on file  Tobacco Use  . Smoking status: Never Smoker  . Smokeless tobacco: Never Used  Substance and Sexual Activity  . Alcohol use: Yes    Alcohol/week: 0.0 oz    Comment: very seldom, but binges sometimes  . Drug use: No  . Sexual activity: Yes    Partners: Male    Birth control/protection: Condom

## 2017-04-14 NOTE — Progress Notes (Deleted)
Pt states a sharp pain in left shoulder and neck with some tingling in left shoulder. Pt states pain has been going on for about 7 months. Pt states moving left shoulder makes it worse, resting makes pain better. -Dye Allergies.

## 2017-04-21 MED FILL — TRIUMEQ 600-50-300 MG TABS: 600-50-300 | 30 days supply | Qty: 30 | Fill #3

## 2017-06-16 ENCOUNTER — Encounter (INDEPENDENT_AMBULATORY_CARE_PROVIDER_SITE_OTHER): Payer: Self-pay | Admitting: Orthopaedic Surgery

## 2017-06-16 ENCOUNTER — Telehealth: Payer: Self-pay | Admitting: Behavioral Health

## 2017-06-16 ENCOUNTER — Ambulatory Visit (INDEPENDENT_AMBULATORY_CARE_PROVIDER_SITE_OTHER): Payer: 59 | Admitting: Orthopaedic Surgery

## 2017-06-16 DIAGNOSIS — G8929 Other chronic pain: Secondary | ICD-10-CM

## 2017-06-16 DIAGNOSIS — M25512 Pain in left shoulder: Secondary | ICD-10-CM | POA: Diagnosis not present

## 2017-06-16 DIAGNOSIS — M542 Cervicalgia: Secondary | ICD-10-CM | POA: Diagnosis not present

## 2017-06-16 NOTE — Addendum Note (Signed)
Addended by: Albertina ParrGARCIA, Mylz Yuan on: 06/16/2017 08:23 AM   Modules accepted: Orders

## 2017-06-16 NOTE — Progress Notes (Signed)
   Office Visit Note   Patient: David Mendez           Date of Birth: [birth year]           MRN: [id number] Visit Date: 06/16/2017              Requested by: Alba CorySowles, Krichna, MD [address] [place], [place] [address] PCP: Alba CorySowles, Krichna, MD   Assessment & Plan: Visit Diagnoses:  1. Chronic left shoulder pain     Plan: Impression is a 22 year old gentleman with possible cervical radiculopathy.  MRI of his left shoulder has been relatively unimpressive.  At this point I would like to obtain MRI of the cervical spine to evaluate for structural abnormalities.  Follow-up after the MRI.  Follow-Up Instructions: Return in about 2 weeks (around 06/30/2017).   Orders:  No orders of the defined types were placed in this encounter.  No orders of the defined types were placed in this encounter.     Procedures: No procedures performed   Clinical Data: No additional findings.   Subjective: Chief Complaint  Patient presents with  . Left Shoulder - Pain, Follow-up    Patient returns today for continued neck and left shoulder pain.  This is radiating into the scapular region.   Review of Systems   Objective: Vital Signs: There were no vitals taken for this visit.  Physical Exam  Ortho Exam Left shoulder exam shows full painless range of motion.  His pain is localized more to the left side of the neck that radiates into the periscapular region. Specialty Comments:  No specialty comments available.  Imaging: No results found.   PMFS History: Patient Active Problem List   Diagnosis Date Noted  . Chronic left shoulder pain 01/13/2017  . Acute pain of left shoulder 01/11/2017  . Low HDL (under 40) 07/07/2015  . Allergic contact dermatitis 11/09/2014  . Chronic constipation 11/09/2014  . History of gonorrhea 11/09/2014  . History of giardia infection 11/09/2014  . HIV disease (HCC) 08/06/2014  . Asthma, mild intermittent 08/06/2014  . Seasonal allergic  rhinitis 08/31/2006   Past Medical History:  Diagnosis Date  . Acute pain of left shoulder 01/11/2017  . Allergy   . Asthma   . Asthma, chronic 08/06/2014  . Chlamydia 06/08/2015  . History of giardia infection   . HIV antibody positive (HCC)   . HIV disease (H[facility]) 08/06/2014  . Industrial dermatitis   . Loss of weight     Family History  Problem Relation Age of Onset  . Asthma Mother   . Allergic rhinitis Mother   . Diabetes Mother   . Asthma Brother        Younger Brother    Past Surgical History:  Procedure Laterality Date  . WISDOM TOOTH EXTRACTION     Social History   Occupational History  . Not on file  Tobacco Use  . Smoking status: Never Smoker  . Smokeless tobacco: Never Used  Substance and Sexual Activity  . Alcohol use: Yes    Alcohol/week: 0.0 oz    Comment: very seldom, but binges sometimes  . Drug use: No  . Sexual activity: Yes    Partners: Male    Birth control/protection: Condom

## 2017-06-16 NOTE — Telephone Encounter (Signed)
Patient called stating he had no received his Triumeq RX via the mail.  Writer informed him that [facility]  Patient states he will call [facility] outpatient pharmacy to find out about getting the medications.  He was informed to call back if he continues to have problem. Angeline SlimAshley Clydean Posas RN

## 2017-06-22 NOTE — Addendum Note (Signed)
Addended by: Alesia MorinPOOLE, Angeliah Wisdom F on: 06/22/2017 11:02 AM   Modules accepted: Orders

## 2017-06-24 ENCOUNTER — Ambulatory Visit
Admission: RE | Admit: 2017-06-24 | Discharge: 2017-06-24 | Disposition: A | Discharge status: Home / Self Care | Payer: 59 | Source: Ambulatory Visit | Attending: Orthopaedic Surgery | Admitting: Orthopaedic Surgery

## 2017-06-24 DIAGNOSIS — M542 Cervicalgia: Secondary | ICD-10-CM

## 2017-06-28 ENCOUNTER — Other Ambulatory Visit: Payer: 59

## 2017-06-28 ENCOUNTER — Other Ambulatory Visit (HOSPITAL_COMMUNITY)
Admission: RE | Admit: 2017-06-28 | Discharge: 2017-06-28 | Disposition: A | Discharge status: Home / Self Care | Payer: 59 | Source: Ambulatory Visit | Attending: Infectious Disease | Admitting: Infectious Disease

## 2017-06-28 DIAGNOSIS — B20 Human immunodeficiency virus [HIV] disease: Secondary | ICD-10-CM | POA: Insufficient documentation

## 2017-06-29 LAB — COMPLETE METABOLIC PANEL WITH GFR
AG RATIO: 2.2 (calc) (ref 1.0–2.5)
ALKALINE PHOSPHATASE (APISO): 77 U/L (ref 40–115)
ALT: 78 U/L — AB (ref 9–46)
AST: 33 U/L (ref 10–40)
Albumin: 5 g/dL (ref 3.6–5.1)
BILIRUBIN TOTAL: 0.9 mg/dL (ref 0.2–1.2)
BUN: 15 mg/dL (ref 7–25)
CHLORIDE: 101 mmol/L (ref 98–110)
CO2: 31 mmol/L (ref 20–32)
Calcium: 10.4 mg/dL — ABNORMAL HIGH (ref 8.6–10.3)
Creat: 1.14 mg/dL (ref 0.60–1.35)
GFR, EST AFRICAN AMERICAN: 104 mL/min/{1.73_m2} (ref >=60)
GFR, Est Non African American: 90 mL/min/{1.73_m2} (ref >=60)
Globulin: 2.3 g/dL (calc) (ref 1.9–3.7)
Glucose, Bld: 89 mg/dL (ref 65–99)
POTASSIUM: 4.3 mmol/L (ref 3.5–5.3)
Sodium: 141 mmol/L (ref 135–146)
Total Protein: 7.3 g/dL (ref 6.1–8.1)

## 2017-06-29 LAB — CBC WITH DIFFERENTIAL/PLATELET
BASOS ABS: 62 {cells}/uL (ref 0–200)
Basophils Relative: 0.7 %
EOS PCT: 2.3 %
Eosinophils Absolute: 202 cells/uL (ref 15–500)
HCT: 47 % (ref 38.5–50.0)
HEMOGLOBIN: 16.9 g/dL (ref 13.2–17.1)
Lymphs Abs: 4594 cells/uL — ABNORMAL HIGH (ref 850–3900)
MCH: 31.8 pg (ref 27.0–33.0)
MCHC: 36 g/dL (ref 32.0–36.0)
MCV: 88.3 fL (ref 80.0–100.0)
MONOS PCT: 5.7 %
MPV: 10.2 fL (ref 7.5–12.5)
NEUTROS ABS: 3441 {cells}/uL (ref 1500–7800)
Neutrophils Relative %: 39.1 %
Platelets: 312 10*3/uL (ref 140–400)
RBC: 5.32 10*6/uL (ref 4.20–5.80)
RDW: 12.4 % (ref 11.0–15.0)
Total Lymphocyte: 52.2 %
WBC mixed population: 502 cells/uL (ref 200–950)
WBC: 8.8 10*3/uL (ref 3.8–10.8)

## 2017-06-29 LAB — T-HELPER CELL (CD4) - (RCID CLINIC ONLY)
CD4 % Helper T Cell: 33 % (ref 33–55)
CD4 T Cell Abs: 1543 /uL (ref 400–2700)

## 2017-06-29 LAB — MICROALBUMIN / CREATININE URINE RATIO
Creatinine, Urine: 158 mg/dL (ref 20–320)
Microalb Creat Ratio: 5 ug/mg{creat}
Microalb, Ur: 0.8 mg/dL

## 2017-06-29 LAB — RPR: RPR: NONREACTIVE

## 2017-06-29 LAB — URINE CYTOLOGY ANCILLARY ONLY
Chlamydia: NEGATIVE
Neisseria Gonorrhea: NEGATIVE

## 2017-06-30 ENCOUNTER — Other Ambulatory Visit (INDEPENDENT_AMBULATORY_CARE_PROVIDER_SITE_OTHER): Payer: Self-pay

## 2017-06-30 ENCOUNTER — Encounter (INDEPENDENT_AMBULATORY_CARE_PROVIDER_SITE_OTHER): Payer: Self-pay | Admitting: Orthopaedic Surgery

## 2017-06-30 ENCOUNTER — Ambulatory Visit (INDEPENDENT_AMBULATORY_CARE_PROVIDER_SITE_OTHER): Payer: 59 | Admitting: Orthopaedic Surgery

## 2017-06-30 VITALS — Ht 66.0 in | Wt 188.0 lb

## 2017-06-30 DIAGNOSIS — G8929 Other chronic pain: Secondary | ICD-10-CM | POA: Diagnosis not present

## 2017-06-30 DIAGNOSIS — M25512 Pain in left shoulder: Secondary | ICD-10-CM

## 2017-06-30 LAB — HIV-1 RNA QUANT-NO REFLEX-BLD
HIV 1 RNA Quant: <20 copies/mL — AB
HIV-1 RNA Quant, Log: <1.3 Log copies/mL — AB

## 2017-06-30 NOTE — Progress Notes (Signed)
   Office Visit Note   Patient: David Mendez           Date of Birth: Jul 14, 1995           MRN: [id number] Visit Date: 06/30/2017              Requested by: Alba CorySowles, Krichna, MD [address] [place], [place] [address] PCP: Alba CorySowles, Krichna, MD   Assessment & Plan: Visit Diagnoses:  1. Chronic left shoulder pain     Plan: He still complains of some trapezial pain that radiates into the scapular and rhomboid region.  I think this is a product of overuse and adhesive capsulitis.  His MRI of the cervical spine is negative.  At this point I recommend light duty for 6 weeks and repeat left shoulder injection with Dr. Alvester MorinNewton.  Questions encouraged and answered.  Follow-up as needed.  Follow-Up Instructions: Return if symptoms worsen or fail to improve.   Orders:  Orders Placed This Encounter  Procedures  . Ambulatory referral to Physical Medicine Rehab   No orders of the defined types were placed in this encounter.     Procedures: No procedures performed   Clinical Data: No additional findings.   Subjective: Chief Complaint  Patient presents with  . Neck - Follow-up    MRI review    Patient follows up today for MRI review of the cervical spine.   Review of Systems   Objective: Vital Signs: Ht 5\' 6"  (1.676 m)   Wt 188 lb (85.3 kg)   BMI 30.34 kg/m   Physical Exam  Ortho Exam Left shoulder exam is stable. Specialty Comments:  No specialty comments available.  Imaging: No results found.   PMFS History: Patient Active Problem List   Diagnosis Date Noted  . Chronic left shoulder pain 01/13/2017  . Acute pain of left shoulder 01/11/2017  . Low HDL (under 40) 07/07/2015  . Allergic contact dermatitis 11/09/2014  . Chronic constipation 11/09/2014  . History of gonorrhea 11/09/2014  . History of giardia infection 11/09/2014  . HIV disease (HCC) 08/06/2014  . Asthma, mild intermittent 08/06/2014  . Seasonal allergic rhinitis 08/31/2006    Past Medical History:  Diagnosis Date  . Acute pain of left shoulder 01/11/2017  . Allergy   . Asthma   . Asthma, chronic 08/06/2014  . Chlamydia 06/08/2015  . History of giardia infection   . HIV antibody positive (H[facility])   . HIV disease (H[facility]) 08/06/2014  . Industrial dermatitis   . Loss of weight     Family History  Problem Relation Age of Onset  . Asthma Mother   . Allergic rhinitis Mother   . Diabetes Mother   . Asthma Brother        Younger Brother    Past Surgical History:  Procedure Laterality Date  . WISDOM TOOTH EXTRACTION     Social History   Occupational History  . Not on file  Tobacco Use  . Smoking status: Never Smoker  . Smokeless tobacco: Never Used  Substance and Sexual Activity  . Alcohol use: Yes    Alcohol/week: 0.0 oz    Comment: very seldom, but binges sometimes  . Drug use: No  . Sexual activity: Yes    Partners: Male    Birth control/protection: Condom

## 2017-07-03 ENCOUNTER — Telehealth (INDEPENDENT_AMBULATORY_CARE_PROVIDER_SITE_OTHER): Payer: Self-pay | Admitting: Orthopaedic Surgery

## 2017-07-03 NOTE — Telephone Encounter (Signed)
See message.

## 2017-07-03 NOTE — Telephone Encounter (Signed)
Drema BalzarineCourtney  [employer]  [phone] Claim number [id number]    Please call to discuss pt care and verify disability information

## 2017-07-09 ENCOUNTER — Other Ambulatory Visit: Payer: Self-pay | Admitting: Infectious Disease

## 2017-07-09 DIAGNOSIS — B20 Human immunodeficiency virus [HIV] disease: Secondary | ICD-10-CM

## 2017-07-12 ENCOUNTER — Encounter: Payer: Self-pay | Admitting: Infectious Disease

## 2017-07-12 ENCOUNTER — Other Ambulatory Visit (HOSPITAL_COMMUNITY)
Admission: RE | Admit: 2017-07-12 | Discharge: 2017-07-12 | Disposition: A | Discharge status: Home / Self Care | Payer: 59 | Source: Ambulatory Visit | Attending: Infectious Disease | Admitting: Infectious Disease

## 2017-07-12 ENCOUNTER — Ambulatory Visit (INDEPENDENT_AMBULATORY_CARE_PROVIDER_SITE_OTHER): Payer: 59 | Admitting: Infectious Disease

## 2017-07-12 VITALS — Wt 185.0 lb

## 2017-07-12 DIAGNOSIS — M25512 Pain in left shoulder: Secondary | ICD-10-CM | POA: Diagnosis not present

## 2017-07-12 DIAGNOSIS — B20 Human immunodeficiency virus [HIV] disease: Secondary | ICD-10-CM | POA: Insufficient documentation

## 2017-07-12 DIAGNOSIS — I889 Nonspecific lymphadenitis, unspecified: Secondary | ICD-10-CM | POA: Diagnosis not present

## 2017-07-12 DIAGNOSIS — Z8619 Personal history of other infectious and parasitic diseases: Secondary | ICD-10-CM | POA: Insufficient documentation

## 2017-07-12 DIAGNOSIS — A749 Chlamydial infection, unspecified: Secondary | ICD-10-CM | POA: Diagnosis not present

## 2017-07-12 HISTORY — DX: Nonspecific lymphadenitis, unspecified: I88.9

## 2017-07-12 NOTE — Progress Notes (Signed)
Chief complaint: followup for  HIV on medications and now with continued shoulder pain on the left side and also some inguinal pain.  Subjective:    Patient ID: David Mendez, male    DOB: Nov 08, 1995, 22 y.o.   MRN: [id number]  HPI  David Mendez is a 22 year-old African-American male with HIV disease who is currently on TRIUMEQ and reasonably virally suppressed  Lab Results  Component Value Date   HIV1RNAQUANT <20 DETECTED (A) 06/28/2017   HIV1RNAQUANT 32 (H) 05/25/2015   HIV1RNAQUANT 92 (H) 11/12/2014    Lab Results  Component Value Date   CD4TABS 1,543 06/28/2017   CD4TABS 1,810 09/20/2016   CD4TABS 900 05/25/2015    He has had some chronic neck and left shoulder pain that has been worked up extensively and followed also by Dr Roda ShuttersXu.  He has a new complaint of some pain in his inguinal area bilaterally..  Past Medical History:  Diagnosis Date  . Acute pain of left shoulder 01/11/2017  . Allergy   . Asthma   . Asthma, chronic 08/06/2014  . Chlamydia 06/08/2015  . History of giardia infection   . HIV antibody positive (HCC)   . HIV disease (HCC) 08/06/2014  . Industrial dermatitis   . Loss of weight    Past Surgical History:  Procedure Laterality Date  . WISDOM TOOTH EXTRACTION       Family History  Problem Relation Age of Onset  . Asthma Mother   . Allergic rhinitis Mother   . Diabetes Mother   . Asthma Brother        Younger Brother    Social History   Tobacco Use  . Smoking status: Never Smoker  . Smokeless tobacco: Never Used  Substance Use Topics  . Alcohol use: Yes    Alcohol/week: 0.0 oz    Comment: very seldom, but binges sometimes  . Drug use: No    Current Outpatient Medications:  .  albuterol (VENTOLIN HFA) 108 (90 Base) MCG/ACT inhaler, Inhale 2 puffs into the lungs every 4 (four) hours as needed for wheezing or shortness of breath., Disp: 18 g, Rfl: 0 .  diclofenac (VOLTAREN) 75 MG EC tablet, Take 1 tablet (75 mg total) by mouth 2 (two)  times daily., Disp: 30 tablet, Rfl: 2 .  fluticasone furoate-vilanterol (BREO ELLIPTA) 100-25 MCG/INH AEPB, Inhale 1 puff daily into the lungs., Disp: 60 each, Rfl: 2 .  tiZANidine (ZANAFLEX) 4 MG tablet, Take 1 tablet (4 mg total) by mouth every 6 (six) hours as needed for muscle spasms., Disp: 30 tablet, Rfl: 2 .  TRIUMEQ 600-50-300 MG tablet, TAKE 1 TABLET BY MOUTH DAILY. (PLEASE SCHEDULE FOLLOW UP APPT [phone] [id number]), Disp: 30 tablet, Rfl: 0  No Known Allergies    Review of Systems  Constitutional: Negative for activity change, appetite change, chills, diaphoresis, fatigue, fever and unexpected weight change.  HENT: Negative for congestion, rhinorrhea, sinus pressure, sneezing, sore throat and trouble swallowing.   Eyes: Negative for photophobia and visual disturbance.  Respiratory: Negative for cough, chest tightness, shortness of breath, wheezing and stridor.   Cardiovascular: Negative for chest pain, palpitations and leg swelling.  Gastrointestinal: Negative for abdominal distention, abdominal pain, anal bleeding, blood in stool, constipation, diarrhea, nausea and vomiting.  Genitourinary: Negative for difficulty urinating, dysuria, flank pain and hematuria.  Musculoskeletal: Positive for joint swelling and myalgias. Negative for arthralgias, back pain and gait problem.  Skin: Negative for color change, pallor, rash and wound.  Neurological: Negative for dizziness,  tremors, weakness and light-headedness.  Hematological: Positive for adenopathy. Does not bruise/bleed easily.  Psychiatric/Behavioral: Negative for agitation, behavioral problems, confusion, decreased concentration, dysphoric mood and sleep disturbance.       Objective:   Physical Exam  Constitutional: He is oriented to person, place, and time. He appears well-developed and well-nourished.  HENT:  Head: Normocephalic and atraumatic.  Eyes: Conjunctivae and EOM are normal.  Neck: Normal range of motion. Neck supple.    Cardiovascular: Normal rate and regular rhythm.  Pulmonary/Chest: Effort normal. No respiratory distress. He has no wheezes.  Abdominal: Soft. He exhibits no distension. Hernia confirmed negative in the right inguinal area and confirmed negative in the left inguinal area.  Genitourinary: Testes normal and penis normal. Circumcised.  Musculoskeletal: He exhibits no edema.       Left shoulder: He exhibits decreased range of motion, tenderness and pain. He exhibits no effusion, no crepitus and no deformity.  Lymphadenopathy:       Right: Inguinal adenopathy present.       Left: Inguinal adenopathy present.  Neurological: He is alert and oriented to person, place, and time.  Skin: Skin is warm and dry. No rash noted. No erythema. No pallor.  Psychiatric: He has a normal mood and affect. His behavior is normal. Judgment and thought content normal.          Assessment & Plan:   New inguinal lymphadenopathy bilaterally: We will check him for STIs with a urine GC and chlamydia also check oral pharynx and rectal GC chlamydia.  RPR was nonreactive on 10 April.   HIV disease: continue TRIUMEQ.    Asthma: Mild intermittent and not requiring corticosteroids or daily bronchodilators.   Hx of Gonorrhea and chlamydia: will recheck for both genital and extragenital today as above  Chronic left-sided shoulder pain neck pain: Being managed by Dr. Roda Shutters  I spent greater than 25 minutes with the patient including greater than 50% of time in face to face counsel of the patient regarding work-up of his inguinal lymphadenopathy regarding his adherence to his antiretroviral regimen and how important this is an commanding him on his excellent job and in coordination of his care.    Acey Lav, MD

## 2017-07-13 LAB — URINE CYTOLOGY ANCILLARY ONLY
Chlamydia: NEGATIVE
Neisseria Gonorrhea: NEGATIVE

## 2017-07-13 LAB — CYTOLOGY, (ORAL, ANAL, URETHRAL) ANCILLARY ONLY
CHLAMYDIA, DNA PROBE: POSITIVE — AB
Chlamydia: NEGATIVE
NEISSERIA GONORRHEA: NEGATIVE
Neisseria Gonorrhea: NEGATIVE

## 2017-07-14 ENCOUNTER — Other Ambulatory Visit: Payer: Self-pay | Admitting: Pharmacist

## 2017-07-14 ENCOUNTER — Telehealth: Payer: Self-pay | Admitting: Behavioral Health

## 2017-07-14 DIAGNOSIS — A749 Chlamydial infection, unspecified: Secondary | ICD-10-CM

## 2017-07-14 DIAGNOSIS — A64 Unspecified sexually transmitted disease: Secondary | ICD-10-CM

## 2017-07-14 MED ORDER — DOXYCYCLINE HYCLATE 100 MG PO TABS: 100.0000 mg | ORAL_TABLET | Freq: Two times a day (BID) | ORAL | 0 refills | Status: DC

## 2017-07-14 NOTE — Telephone Encounter (Signed)
Either is fine. THere is some data that doxy is better for rectal chlamydia but I believe this was found in urine  If this is in just urine Azithromycin 1 gram once is just fine

## 2017-07-14 NOTE — Telephone Encounter (Signed)
Called patient, verified identity, informed him that Doxycycline 100 mg BID x7 days was called into [facility] [address] [place].  Patient verbalized understanding, informed him to inform partners to be tested and treated.  Patient verbalized understanding. Angeline SlimAshley Hill RN

## 2017-07-14 NOTE — Telephone Encounter (Signed)
-----   Message from Randall Hissornelius N Van Dam, MD sent at 07/13/2017 11:35 PM EDT ----- Pt should get azithromycin 1 gram x 1 vs doxycycline 100mg  twice daily for chlamydia and partners should be tested, treated ----- Message ----- From: Interface, Lab In Three Zero One Sent: 07/13/2017   2:03 PM To: Randall Hissornelius N Van Dam, MD

## 2017-07-14 NOTE — Telephone Encounter (Signed)
Doxy 100 mg bid x 7 days 

## 2017-07-14 NOTE — Telephone Encounter (Signed)
Perfect

## 2017-07-17 ENCOUNTER — Other Ambulatory Visit: Payer: Self-pay | Admitting: Pharmacist

## 2017-07-17 NOTE — Progress Notes (Signed)
Patient getting his [facility]q [facility] [place] now

## 2017-07-24 ENCOUNTER — Ambulatory Visit (INDEPENDENT_AMBULATORY_CARE_PROVIDER_SITE_OTHER): Payer: 59 | Admitting: Physical Medicine and Rehabilitation

## 2017-07-24 ENCOUNTER — Ambulatory Visit (INDEPENDENT_AMBULATORY_CARE_PROVIDER_SITE_OTHER): Payer: 59

## 2017-07-24 ENCOUNTER — Encounter (INDEPENDENT_AMBULATORY_CARE_PROVIDER_SITE_OTHER): Payer: Self-pay | Admitting: Physical Medicine and Rehabilitation

## 2017-07-24 DIAGNOSIS — G8929 Other chronic pain: Secondary | ICD-10-CM

## 2017-07-24 DIAGNOSIS — M25512 Pain in left shoulder: Secondary | ICD-10-CM | POA: Diagnosis not present

## 2017-07-24 MED ORDER — TRIAMCINOLONE ACETONIDE 40 MG/ML IJ SUSP
80.0000 mg | INTRAMUSCULAR | Status: AC | PRN
  Administered 2017-07-24: 80 mg via INTRA_ARTICULAR

## 2017-07-24 MED ORDER — BUPIVACAINE HCL 0.5 % IJ SOLN
3.0000 mL | INTRAMUSCULAR | Status: AC | PRN
  Administered 2017-07-24: 3 mL via INTRA_ARTICULAR

## 2017-07-24 NOTE — Progress Notes (Signed)
.  Numeric Pain Rating Scale and Functional Assessment Average Pain 4   In the last MONTH (on 0-10 scale) has pain interfered with the following?  1. General activity like being  able to carry out your everyday physical activities such as walking, climbing stairs, carrying groceries, or moving a chair?  Rating(9)   -Dye Allergies.

## 2017-07-24 NOTE — Progress Notes (Signed)
David Mendez - 22 y.o. male MRN [id number]  Date of birth: Jun 30, 1995  Office Visit Note: Visit Date: 07/24/2017 PCP: Alba Cory, MD Referred by: Alba Cory, MD  Subjective: Chief Complaint  Patient presents with  . Left Shoulder - Pain  . Neck - Pain   HPI: [name] is a 22 year old gentleman that we saw a few months ago and completed left glenohumeral joint anesthetic arthrogram with good relief of his shoulder pain with increased range of motion.  He recently saw Dr. Roda Shutters for reevaluation.  He still having pain along the trapezius area and the top of the shoulder.  He reports continued increased range of motion.  Dr. Roda Shutters suggested repeat injection continued exercises.  Would suggest formal physical therapy with dry needling potentially for the trapezial pain.   ROS Otherwise per HPI.  Assessment & Plan: Visit Diagnoses:  1. Chronic left shoulder pain     Plan: Findings:  Intra-articular glenohumeral joint injection fluoroscopic guidance of the left glenohumeral joint.    Meds & Orders: No orders of the defined types were placed in this encounter.   Orders Placed This Encounter  Procedures  . Large Joint Inj: L glenohumeral  . XR C-ARM NO REPORT    Follow-up: Return if symptoms worsen or fail to improve, for Dr. Roda Shutters.   Procedures: Large Joint Inj: L glenohumeral on 07/24/2017 8:58 AM Indications: pain and diagnostic evaluation Details: 22 G 3.5 in needle, fluoroscopy-guided anteromedial approach  Arthrogram: No  Medications: 3 mL bupivacaine 0.5 %; 80 mg triamcinolone acetonide 40 MG/ML Outcome: tolerated well, no immediate complications  There was excellent flow of contrast producing a partial arthrogram of the glenohumeral joint.  The patient continued to have the trapezial type pain after the injection but did report some increased range of motion of the shoulder. Procedure, treatment alternatives, risks and benefits explained, specific risks discussed.  Consent was given by the patient. Immediately prior to procedure a time out was called to verify the correct patient, procedure, equipment, support staff and site/side marked as required. Patient was prepped and draped in the usual sterile fashion.      No notes on file   Clinical History: Shoulder MRI IMPRESSION: 1. Supraspinatus tendinopathy/tendinosis with probable interstitial tears but no partial or full-thickness tear. 2. There is thickening of the capsular structures in the axillary recess which can be seen with adhesive capsulitis or synovitis. 3. Intact long head biceps tendon and glenoid labrum. 4. No findings for bony impingement.   Electronically Signed   By: Rudie Meyer M.D.   On: 03/02/2017 08:26   He reports that he has never smoked. He has never used smokeless tobacco. No results for input(s): HGBA1C, LABURIC in the last 8760 hours.  Objective:  VS:  HT:    WT:   BMI:     BP:   HR: bpm  TEMP: ( )  RESP:  Physical Exam  Ortho Exam Imaging: Xr C-arm No Report  Result Date: 07/24/2017 Please see Notes or Procedures tab for imaging impression.   Past Medical/Family/Surgical/Social History: Medications & Allergies reviewed per EMR, new medications updated. Patient Active Problem List   Diagnosis Date Noted  . Inguinal lymphadenitis 07/12/2017  . Chronic left shoulder pain 01/13/2017  . Acute pain of left shoulder 01/11/2017  . Low HDL (under 40) 07/07/2015  . Allergic contact dermatitis 11/09/2014  . Chronic constipation 11/09/2014  . History of gonorrhea 11/09/2014  . History of giardia infection 11/09/2014  . HIV  disease (HCC) 08/06/2014  . Asthma, mild intermittent 08/06/2014  . Seasonal allergic rhinitis 08/31/2006   Past Medical History:  Diagnosis Date  . Acute pain of left shoulder 01/11/2017  . Allergy   . Asthma   . Asthma, chronic 08/06/2014  . Chlamydia 06/08/2015  . History of giardia infection   . HIV antibody positive (HCC)   .  HIV disease (HCC) 08/06/2014  . Industrial dermatitis   . Inguinal lymphadenitis 07/12/2017  . Loss of weight    Family History  Problem Relation Age of Onset  . Asthma Mother   . Allergic rhinitis Mother   . Diabetes Mother   . Asthma Brother        Younger Brother   Past Surgical History:  Procedure Laterality Date  . WISDOM TOOTH EXTRACTION     Social History   Occupational History  . Not on file  Tobacco Use  . Smoking status: Never Smoker  . Smokeless tobacco: Never Used  Substance and Sexual Activity  . Alcohol use: Yes    Alcohol/week: 0.0 oz    Comment: very seldom, but binges sometimes  . Drug use: No  . Sexual activity: Yes    Partners: Male    Birth control/protection: Condom

## 2017-07-24 NOTE — Patient Instructions (Signed)

## 2017-08-05 ENCOUNTER — Other Ambulatory Visit: Payer: Self-pay | Admitting: Infectious Disease

## 2017-08-05 DIAGNOSIS — B20 Human immunodeficiency virus [HIV] disease: Secondary | ICD-10-CM

## 2017-08-07 ENCOUNTER — Telehealth (INDEPENDENT_AMBULATORY_CARE_PROVIDER_SITE_OTHER): Payer: Self-pay | Admitting: Orthopaedic Surgery

## 2017-08-07 ENCOUNTER — Encounter (INDEPENDENT_AMBULATORY_CARE_PROVIDER_SITE_OTHER): Payer: Self-pay

## 2017-08-07 NOTE — Telephone Encounter (Signed)
Injection done on 5/6, any work restrictions or notes other than around injection would need to go through Dr. Roda Shutters

## 2017-08-07 NOTE — Telephone Encounter (Signed)
Note made. Patient is aware .

## 2017-08-07 NOTE — Telephone Encounter (Signed)
Sorry meant to send to [facility] as well

## 2017-08-07 NOTE — Telephone Encounter (Signed)
See message. Please advise.

## 2017-08-07 NOTE — Telephone Encounter (Signed)
Letter ready for pick up at the front desk

## 2017-08-07 NOTE — Telephone Encounter (Signed)
2 weeks.  

## 2017-08-07 NOTE — Telephone Encounter (Signed)
Left shoulder pain associated with neck pain. Pt will return to work 08/11/17 and would like to know if work note can be extended.

## 2017-08-10 ENCOUNTER — Ambulatory Visit (INDEPENDENT_AMBULATORY_CARE_PROVIDER_SITE_OTHER): Payer: 59 | Admitting: Orthopaedic Surgery

## 2017-08-15 ENCOUNTER — Telehealth (INDEPENDENT_AMBULATORY_CARE_PROVIDER_SITE_OTHER): Payer: Self-pay | Admitting: Orthopaedic Surgery

## 2017-08-15 NOTE — Telephone Encounter (Signed)
Left message on VM with [facility]

## 2017-08-15 NOTE — Telephone Encounter (Signed)
Beverly with [employer] called and wanted some clarification in regards to the extended work note.  She stated that the note states no restrictions, however, he is in a sedentary position at work.  She wants to know if there are any restrictions in order to keep him covered.  CB# [phone].  Thank you.

## 2017-08-15 NOTE — Telephone Encounter (Signed)
No restrictions

## 2017-08-15 NOTE — Telephone Encounter (Signed)
Please advise 

## 2017-08-16 ENCOUNTER — Ambulatory Visit (INDEPENDENT_AMBULATORY_CARE_PROVIDER_SITE_OTHER): Payer: 59 | Admitting: Orthopaedic Surgery

## 2017-08-16 ENCOUNTER — Encounter (INDEPENDENT_AMBULATORY_CARE_PROVIDER_SITE_OTHER): Payer: Self-pay | Admitting: Orthopaedic Surgery

## 2017-08-16 DIAGNOSIS — M25512 Pain in left shoulder: Secondary | ICD-10-CM | POA: Diagnosis not present

## 2017-08-16 DIAGNOSIS — G8929 Other chronic pain: Secondary | ICD-10-CM

## 2017-08-16 NOTE — Progress Notes (Signed)
Office Visit Note   Patient: David Mendez           Date of Birth: [birth year]           MRN: [id number] Visit Date: 08/16/2017              Requested by: Alba Cory, MD [address] [place], [place] [address] PCP: Alba Cory, MD   Assessment & Plan: Visit Diagnoses:  1. Chronic left shoulder pain     Plan: Impression is continued left shoulder pain likely from overuse.  From my standpoint we have obtained cervical and shoulder MRIs which are relatively unremarkable.  I think physical therapy would be of great benefit to help with his posture and with performing his job.  I have placed him on 3 months of lifting restrictions so hopefully this will help.  From my standpoint follow-up as needed.  I discussed with him that I cannot identify any surgical problems based on the MRIs.    Follow-Up Instructions: Return if symptoms worsen or fail to improve.   Orders:  No orders of the defined types were placed in this encounter.  No orders of the defined types were placed in this encounter.     Procedures: No procedures performed   Clinical Data: No additional findings.   Subjective: Chief Complaint  Patient presents with  . Left Shoulder - Pain    Patient follows up today for continued left shoulder pain.  He did have a left shoulder injection on 07/24/2017 with Dr. Alvester Morin which gave him 2 weeks of relief.  He still has pain in his trapezius.   Review of Systems   Objective: Vital Signs: There were no vitals taken for this visit.  Physical Exam  Ortho Exam Left shoulder exam shows full range of motion.  No focal deficits. Specialty Comments:  No specialty comments available.  Imaging: No results found.   PMFS History: Patient Active Problem List   Diagnosis Date Noted  . Inguinal lymphadenitis 07/12/2017  . Chronic left shoulder pain 01/13/2017  . Acute pain of left shoulder 01/11/2017  . Low HDL (under 40) 07/07/2015  . Allergic  contact dermatitis 11/09/2014  . Chronic constipation 11/09/2014  . History of gonorrhea 11/09/2014  . History of giardia infection 11/09/2014  . HIV disease (HCC) 08/06/2014  . Asthma, mild intermittent 08/06/2014  . Seasonal allergic rhinitis 08/31/2006   Past Medical History:  Diagnosis Date  . Acute pain of left shoulder 01/11/2017  . Allergy   . Asthma   . Asthma, chronic 08/06/2014  . Chlamydia 06/08/2015  . History of giardia infection   . HIV antibody positive (HCC)   . HIV disease (HCC) 08/06/2014  . Industrial dermatitis   . Inguinal lymphadenitis 07/12/2017  . Loss of weight     Family History  Problem Relation Age of Onset  . Asthma Mother   . Allergic rhinitis Mother   . Diabetes Mother   . Asthma Brother        Younger Brother    Past Surgical History:  Procedure Laterality Date  . WISDOM TOOTH EXTRACTION     Social History   Occupational History  . Not on file  Tobacco Use  . Smoking status: Never Smoker  . Smokeless tobacco: Never Used  Substance and Sexual Activity  . Alcohol use: Yes    Alcohol/week: 0.0 oz    Comment: very seldom, but binges sometimes  . Drug use: No  . Sexual activity: Yes  Partners: Male    Birth control/protection: Condom

## 2017-08-22 ENCOUNTER — Telehealth (INDEPENDENT_AMBULATORY_CARE_PROVIDER_SITE_OTHER): Payer: Self-pay | Admitting: Orthopaedic Surgery

## 2017-08-22 NOTE — Telephone Encounter (Signed)
Patient called wanting to speak with you about his disability papers. CB # [phone]

## 2017-08-24 ENCOUNTER — Telehealth (INDEPENDENT_AMBULATORY_CARE_PROVIDER_SITE_OTHER): Payer: Self-pay | Admitting: Orthopaedic Surgery

## 2017-08-24 NOTE — Telephone Encounter (Signed)
See message below °

## 2017-08-24 NOTE — Telephone Encounter (Signed)
See message below - please advise

## 2017-08-24 NOTE — Telephone Encounter (Signed)
See message.

## 2017-08-24 NOTE — Telephone Encounter (Signed)
Patient request a call back at [phone], he states his employer has a question regarding the paperwork and the office note that he was given.

## 2017-08-24 NOTE — Telephone Encounter (Signed)
Patients employer also had a question about the no lifting restriction, she wanted to know how much weight? He said they normally don't lift anything at his job.  Also, he is requesting these questions to be answered as soon as possible because he is supposed to work tomorrow night. Patients # [phone]

## 2017-08-24 NOTE — Telephone Encounter (Signed)
I already spoke to him. He said he was going to call you about being out of work until physical therapy was over.

## 2017-08-25 ENCOUNTER — Telehealth (INDEPENDENT_AMBULATORY_CARE_PROVIDER_SITE_OTHER): Payer: Self-pay

## 2017-08-25 ENCOUNTER — Encounter (INDEPENDENT_AMBULATORY_CARE_PROVIDER_SITE_OTHER): Payer: Self-pay

## 2017-08-25 NOTE — Telephone Encounter (Signed)
Spoke toh im

## 2017-08-25 NOTE — Telephone Encounter (Signed)
Less than 10 lbs

## 2017-08-25 NOTE — Telephone Encounter (Signed)
Please call patient we just keep going back and fourth and he would like to speak to you now.    Letter faxed

## 2017-08-25 NOTE — Telephone Encounter (Signed)
no

## 2017-08-25 NOTE — Telephone Encounter (Signed)
Work note ready for pick up at the front desk.  

## 2017-08-25 NOTE — Telephone Encounter (Signed)
Employer would like to know if any restrictions regarding  motion ? Please advise. Would like a work note regarding this.

## 2017-08-25 NOTE — Telephone Encounter (Signed)
FAXED LETTER TO [phone] PER PATIENT

## 2017-08-28 NOTE — Telephone Encounter (Signed)
I'm going to let you do this one

## 2017-08-28 NOTE — Telephone Encounter (Signed)
ok 

## 2017-08-28 NOTE — Telephone Encounter (Signed)
Patient called asking for another note from Dr. Roda ShuttersXu stating that he said that if his shoulder wasn't healing correctly or fast enough that he should find another job. He would like a call back when ready. Thank you. CB # [phone]

## 2017-08-28 NOTE — Telephone Encounter (Signed)
Please advise 

## 2017-08-29 NOTE — Telephone Encounter (Signed)
What would you like note to say exactly?

## 2017-08-29 NOTE — Telephone Encounter (Signed)
No repetitive use of affected arm.  No lifting more than 25 lbs. X 6 weeks

## 2017-08-30 ENCOUNTER — Encounter (INDEPENDENT_AMBULATORY_CARE_PROVIDER_SITE_OTHER): Payer: Self-pay

## 2017-08-30 NOTE — Telephone Encounter (Signed)
Note made.  

## 2017-08-30 NOTE — Telephone Encounter (Signed)
Called patient to let him know note is ready for pick up no answer LMOM.

## 2017-09-01 ENCOUNTER — Other Ambulatory Visit: Payer: Self-pay | Admitting: *Deleted

## 2017-09-01 DIAGNOSIS — B20 Human immunodeficiency virus [HIV] disease: Secondary | ICD-10-CM

## 2017-09-01 MED ORDER — ABACAVIR-DOLUTEGRAVIR-LAMIVUD 600-50-300 MG PO TABS
ORAL_TABLET | ORAL | 5 refills | Status: DC
Start: 2017-09-01 — End: 2018-02-01

## 2017-09-11 ENCOUNTER — Telehealth (INDEPENDENT_AMBULATORY_CARE_PROVIDER_SITE_OTHER): Payer: Self-pay | Admitting: Orthopaedic Surgery

## 2017-09-11 NOTE — Telephone Encounter (Signed)
Adelina MingsKelsey with [employer] disability called and would like to confirm restrictions and limitations, ongoing treatment plan for him, and if he has had any X-rays or MRI's. Leonie Douglas[employer] # [phone] reference # [id number]

## 2017-09-11 NOTE — Telephone Encounter (Signed)
See message below °

## 2017-09-11 NOTE — Telephone Encounter (Signed)
Yes he's had a full work up.

## 2017-12-24 ENCOUNTER — Other Ambulatory Visit: Payer: Self-pay | Admitting: Family Medicine

## 2017-12-24 DIAGNOSIS — J4521 Mild intermittent asthma with (acute) exacerbation: Secondary | ICD-10-CM

## 2017-12-25 MED ORDER — ALBUTEROL SULFATE HFA 108 (90 BASE) MCG/ACT IN AERS: 2.0000 | INHALATION_SPRAY | RESPIRATORY_TRACT | 0 refills | Status: DC | PRN

## 2017-12-25 NOTE — Telephone Encounter (Signed)
Refill request for general medication: Ventolin 108  Last office visit: 12/06/2016  Last physical exam: None indicated  Follow-ups on file. None indicated-- I don't think he is your patient anymore. Last physical was at [facility]. He has not been here in over a year, but he sent message to you via MyChart.

## 2017-12-28 ENCOUNTER — Other Ambulatory Visit: Payer: 59

## 2018-01-11 ENCOUNTER — Encounter: Payer: 59 | Admitting: Infectious Disease

## 2018-02-01 ENCOUNTER — Telehealth: Payer: Self-pay

## 2018-02-01 ENCOUNTER — Other Ambulatory Visit: Payer: Self-pay | Admitting: Pharmacist

## 2018-02-01 DIAGNOSIS — B20 Human immunodeficiency virus [HIV] disease: Secondary | ICD-10-CM

## 2018-02-01 MED ORDER — ABACAVIR-DOLUTEGRAVIR-LAMIVUD 600-50-300 MG PO TABS: 1.0000 | ORAL_TABLET | Freq: Every day | ORAL | 2 refills | Status: DC

## 2018-02-01 MED FILL — TRIUMEQ 600-50-300 MG TABS: 600-50-300 | 30 days supply | Qty: 30 | Fill #0

## 2018-02-01 NOTE — Telephone Encounter (Signed)
Patient called office today requesting referral to [facility]. Patient has received patient assitance from PenelopeBetty, [employer] for [employer]. Patient will need to see Financial counselor at [facility] before establishing care. Will route message to Dr. Daiva EvesVan Dam for referral. Lorenso CourierJose L Akin Yi, CMA

## 2018-02-01 NOTE — Telephone Encounter (Signed)
Do I need to put in referral??

## 2018-02-21 ENCOUNTER — Other Ambulatory Visit: Payer: Self-pay | Admitting: Pharmacist

## 2018-02-21 DIAGNOSIS — B20 Human immunodeficiency virus [HIV] disease: Secondary | ICD-10-CM

## 2018-02-21 MED ORDER — ABACAVIR-DOLUTEGRAVIR-LAMIVUD 600-50-300 MG PO TABS: 1.0000 | ORAL_TABLET | Freq: Every day | ORAL | 2 refills | Status: DC

## 2018-02-26 MED FILL — TRIUMEQ 600-50-300 MG TABS: 600-50-300 | 30 days supply | Qty: 30 | Fill #1

## 2018-02-28 ENCOUNTER — Ambulatory Visit: Payer: Self-pay

## 2018-04-11 MED FILL — TRIUMEQ 600-50-300 MG TABS: 600-50-300 | 30 days supply | Qty: 30 | Fill #2

## 2018-05-10 ENCOUNTER — Telehealth: Payer: Self-pay | Admitting: Pharmacy Technician

## 2018-05-10 NOTE — Telephone Encounter (Signed)
[facility] Patient Advocate Encounter  Called patient to touch base with him to see if he wanted to continue care with [facility] or switching to [facility] to get his Triumeq refill.  He stated that he was still undecided due to change in status/unemployment and was unaware that there was medical assistance in addition to medication assistance.  He wishes to make an appointment for [facility] assistance and continue his care at this clinic.  Patient will call back Monday to get scheduled for an appointment and begin apply for financial assistance.  He knows to call the clinic for any further questions that were not addressed today.    Beulah Gandy, CPhT Specialty Pharmacy Patient [facility] Phone: [phone] Fax: [phone] 05/10/2018 3:33 PM

## 2018-05-15 ENCOUNTER — Ambulatory Visit: Payer: Self-pay

## 2018-08-09 ENCOUNTER — Ambulatory Visit: Payer: Self-pay

## 2018-08-09 ENCOUNTER — Other Ambulatory Visit: Payer: Self-pay

## 2018-08-14 ENCOUNTER — Ambulatory Visit: Payer: Self-pay

## 2018-08-14 ENCOUNTER — Encounter: Payer: Self-pay | Admitting: Infectious Disease

## 2018-08-14 ENCOUNTER — Other Ambulatory Visit: Payer: Self-pay

## 2018-08-14 DIAGNOSIS — M25512 Pain in left shoulder: Secondary | ICD-10-CM

## 2018-08-14 DIAGNOSIS — Z8619 Personal history of other infectious and parasitic diseases: Secondary | ICD-10-CM

## 2018-08-14 DIAGNOSIS — I889 Nonspecific lymphadenitis, unspecified: Secondary | ICD-10-CM

## 2018-08-14 DIAGNOSIS — B20 Human immunodeficiency virus [HIV] disease: Secondary | ICD-10-CM

## 2018-08-15 LAB — URINE CYTOLOGY ANCILLARY ONLY
Chlamydia: NEGATIVE
Neisseria Gonorrhea: NEGATIVE

## 2018-08-15 LAB — T-HELPER CELL (CD4) - (RCID CLINIC ONLY)
CD4 % Helper T Cell: 37 % (ref 33–65)
CD4 T Cell Abs: 1377 /uL (ref 400–1790)

## 2018-08-19 LAB — CBC WITH DIFFERENTIAL/PLATELET
Absolute Monocytes: 464 cells/uL (ref 200–950)
Basophils Absolute: 53 cells/uL (ref 0–200)
Basophils Relative: 0.7 %
Eosinophils Absolute: 342 cells/uL (ref 15–500)
Eosinophils Relative: 4.5 %
HCT: 46.7 % (ref 38.5–50.0)
Hemoglobin: 16.3 g/dL (ref 13.2–17.1)
Lymphs Abs: 3686 cells/uL (ref 850–3900)
MCH: 31 pg (ref 27.0–33.0)
MCHC: 34.9 g/dL (ref 32.0–36.0)
MCV: 89 fL (ref 80.0–100.0)
MPV: 10.7 fL (ref 7.5–12.5)
Monocytes Relative: 6.1 %
Neutro Abs: 3055 cells/uL (ref 1500–7800)
Neutrophils Relative %: 40.2 %
Platelets: 279 10*3/uL (ref 140–400)
RBC: 5.25 10*6/uL (ref 4.20–5.80)
RDW: 12.9 % (ref 11.0–15.0)
Total Lymphocyte: 48.5 %
WBC: 7.6 10*3/uL (ref 3.8–10.8)

## 2018-08-19 LAB — LIPID PANEL
Cholesterol: 184 mg/dL (ref <200)
HDL: 30 mg/dL — ABNORMAL LOW (ref >=40)
LDL Cholesterol (Calc): 116 mg/dL (calc) — ABNORMAL HIGH
Non-HDL Cholesterol (Calc): 154 mg/dL (calc) — ABNORMAL HIGH (ref <130)
Total CHOL/HDL Ratio: 6.1 (calc) — ABNORMAL HIGH (ref <5.0)
Triglycerides: 269 mg/dL — ABNORMAL HIGH (ref <150)

## 2018-08-19 LAB — COMPLETE METABOLIC PANEL WITH GFR
AG Ratio: 2 (calc) (ref 1.0–2.5)
ALT: 45 U/L (ref 9–46)
AST: 17 U/L (ref 10–40)
Albumin: 5.1 g/dL (ref 3.6–5.1)
Alkaline phosphatase (APISO): 91 U/L (ref 36–130)
BUN: 16 mg/dL (ref 7–25)
CO2: 30 mmol/L (ref 20–32)
Calcium: 10.4 mg/dL — ABNORMAL HIGH (ref 8.6–10.3)
Chloride: 105 mmol/L (ref 98–110)
Creat: 1.01 mg/dL (ref 0.60–1.35)
GFR, Est African American: 120 mL/min/{1.73_m2} (ref >=60)
GFR, Est Non African American: 104 mL/min/{1.73_m2} (ref >=60)
Globulin: 2.5 g/dL (calc) (ref 1.9–3.7)
Glucose, Bld: 86 mg/dL (ref 65–99)
Potassium: 4.4 mmol/L (ref 3.5–5.3)
Sodium: 142 mmol/L (ref 135–146)
Total Bilirubin: 1.3 mg/dL — ABNORMAL HIGH (ref 0.2–1.2)
Total Protein: 7.6 g/dL (ref 6.1–8.1)

## 2018-08-19 LAB — HIV-1 RNA QUANT-NO REFLEX-BLD
HIV 1 RNA Quant: 425 copies/mL — ABNORMAL HIGH
HIV-1 RNA Quant, Log: 2.63 Log copies/mL — ABNORMAL HIGH

## 2018-08-19 LAB — RPR: RPR Ser Ql: NONREACTIVE

## 2018-08-22 ENCOUNTER — Other Ambulatory Visit: Payer: Self-pay | Admitting: Family Medicine

## 2018-08-22 ENCOUNTER — Other Ambulatory Visit: Payer: Self-pay

## 2018-08-22 ENCOUNTER — Ambulatory Visit (INDEPENDENT_AMBULATORY_CARE_PROVIDER_SITE_OTHER): Payer: Self-pay | Admitting: Infectious Disease

## 2018-08-22 ENCOUNTER — Encounter: Payer: Self-pay | Admitting: Infectious Disease

## 2018-08-22 VITALS — BP 122/77 | HR 111 | Temp 99.0°F | Wt 179.0 lb

## 2018-08-22 DIAGNOSIS — Z8619 Personal history of other infectious and parasitic diseases: Secondary | ICD-10-CM

## 2018-08-22 DIAGNOSIS — J4521 Mild intermittent asthma with (acute) exacerbation: Secondary | ICD-10-CM

## 2018-08-22 DIAGNOSIS — B20 Human immunodeficiency virus [HIV] disease: Secondary | ICD-10-CM

## 2018-08-22 MED ORDER — DOLUTEGRAVIR-LAMIVUDINE 50-300 MG PO TABS: 1.0000 | ORAL_TABLET | Freq: Every day | ORAL | 11 refills | Status: DC

## 2018-08-22 NOTE — Progress Notes (Signed)
Chief complaint: followup for  HIV on medications   Subjective:    Patient ID: David Mendez, male    DOB: 1995/05/07, 23 y.o.   MRN: [id number]  HPI  Mr. David HumphreysMebane is a 23 year-old African-American male with HIV disease which was previously well controlled on Triumeq.  However he did not renew his HIV medication assistance program on time when he was equivocating about whether to go to [facility] or here for his care.  Now his HIV medication system program is active.  I wish I had known about this problem because we could have used a pharmaceutical assistance program to get him more medications.  He has been off the Triumeq for at least a month and now has viremia into the 400 range.  We discussed his current regimen and the options of simplifying it and getting rid of the ABC, and after further discussion he decided to go switch to DOVATO   Past Medical History:  Diagnosis Date  . Acute pain of left shoulder 01/11/2017  . Allergy   . Asthma   . Asthma, chronic 08/06/2014  . Chlamydia 06/08/2015  . History of giardia infection   . HIV antibody positive (HCC)   . HIV disease (HCC) 08/06/2014  . Industrial dermatitis   . Inguinal lymphadenitis 07/12/2017  . Loss of weight    Past Surgical History:  Procedure Laterality Date  . WISDOM TOOTH EXTRACTION       Family History  Problem Relation Age of Onset  . Asthma Mother   . Allergic rhinitis Mother   . Diabetes Mother   . Asthma Brother        Younger Brother    Social History   Tobacco Use  . Smoking status: Never Smoker  . Smokeless tobacco: Never Used  Substance Use Topics  . Alcohol use: Yes    Alcohol/week: 0.0 standard drinks    Comment: very seldom, but binges sometimes  . Drug use: No    Current Outpatient Medications:  .  abacavir-dolutegravir-lamiVUDine (TRIUMEQ) 600-50-300 MG tablet, Take 1 tablet by mouth daily., Disp: 30 tablet, Rfl: 2 .  albuterol (VENTOLIN HFA) 108 (90 Base) MCG/ACT inhaler,  Inhale 2 puffs into the lungs every 4 (four) hours as needed for wheezing or shortness of breath., Disp: 18 g, Rfl: 0 .  diclofenac (VOLTAREN) 75 MG EC tablet, Take 1 tablet (75 mg total) by mouth 2 (two) times daily., Disp: 30 tablet, Rfl: 2 .  fluticasone furoate-vilanterol (BREO ELLIPTA) 100-25 MCG/INH AEPB, Inhale 1 puff daily into the lungs., Disp: 60 each, Rfl: 2 .  doxycycline (VIBRA-TABS) 100 MG tablet, Take 1 tablet (100 mg total) by mouth 2 (two) times daily. (Patient not taking: Reported on 08/22/2018), Disp: 14 tablet, Rfl: 0 .  tiZANidine (ZANAFLEX) 4 MG tablet, Take 1 tablet (4 mg total) by mouth every 6 (six) hours as needed for muscle spasms. (Patient not taking: Reported on 08/22/2018), Disp: 30 tablet, Rfl: 2  No Known Allergies    Review of Systems  Constitutional: Negative for activity change, appetite change, chills, diaphoresis, fatigue, fever and unexpected weight change.  HENT: Negative for congestion, rhinorrhea, sinus pressure, sneezing, sore throat and trouble swallowing.   Eyes: Negative for photophobia and visual disturbance.  Respiratory: Negative for cough, chest tightness, shortness of breath, wheezing and stridor.   Cardiovascular: Negative for chest pain, palpitations and leg swelling.  Gastrointestinal: Negative for abdominal distention, abdominal pain, anal bleeding, blood in stool, constipation, diarrhea, nausea and  vomiting.  Genitourinary: Negative for difficulty urinating, dysuria, flank pain and hematuria.  Musculoskeletal: Negative for arthralgias, back pain, gait problem and myalgias.  Skin: Negative for color change, pallor, rash and wound.  Neurological: Negative for dizziness, tremors, weakness and light-headedness.  Hematological: Negative for adenopathy. Does not bruise/bleed easily.  Psychiatric/Behavioral: Negative for agitation, behavioral problems, confusion, decreased concentration, dysphoric mood and sleep disturbance.       Objective:    Physical Exam  Constitutional: He is oriented to person, place, and time. He appears well-developed and well-nourished.  HENT:  Head: Normocephalic and atraumatic.  Eyes: Conjunctivae and EOM are normal.  Neck: Normal range of motion. Neck supple.  Cardiovascular: Normal rate and regular rhythm.  Pulmonary/Chest: Effort normal. No respiratory distress. He has no wheezes.  Abdominal: Soft. He exhibits no distension. Hernia confirmed negative in the right inguinal area and confirmed negative in the left inguinal area.  Genitourinary:    Testes and penis normal.  Circumcised.  Musculoskeletal:        General: No edema.     Left shoulder: He exhibits normal range of motion, no tenderness, no effusion, no crepitus, no deformity and no pain.  Neurological: He is alert and oriented to person, place, and time.  Skin: Skin is warm and dry. No rash noted. No erythema. No pallor.  Psychiatric: He has a normal mood and affect. His behavior is normal. Judgment and thought content normal.          Assessment & Plan:    HIV disease: switch to DOVATO, and bring back  In July for same day visit, HMAP renewal and labs  Hx of Gonorrhea and chlamydia: will recheck for both genital and extragenital today as above   I spent greater than 25 minutes with the patient including greater than 50% of time in face to face counsel of the patient re various single tablet regimen options  and in coordination of his care.      Acey Lav, MD

## 2018-08-23 NOTE — Telephone Encounter (Signed)
Pt states he will all back to schedule

## 2018-08-24 LAB — URINE CYTOLOGY ANCILLARY ONLY
Chlamydia: NEGATIVE
Neisseria Gonorrhea: NEGATIVE

## 2018-08-24 LAB — CYTOLOGY, (ORAL, ANAL, URETHRAL) ANCILLARY ONLY
Chlamydia: NEGATIVE
Chlamydia: NEGATIVE
Neisseria Gonorrhea: NEGATIVE
Neisseria Gonorrhea: NEGATIVE

## 2018-08-24 MED ORDER — ALBUTEROL SULFATE HFA 108 (90 BASE) MCG/ACT IN AERS: 2.0000 | INHALATION_SPRAY | RESPIRATORY_TRACT | 0 refills | Status: DC | PRN

## 2018-08-24 NOTE — Telephone Encounter (Signed)
Pt has an appt on 09/04/2018

## 2018-09-04 ENCOUNTER — Other Ambulatory Visit: Payer: Self-pay

## 2018-09-04 ENCOUNTER — Ambulatory Visit: Payer: 59 | Admitting: Family Medicine

## 2018-09-05 ENCOUNTER — Encounter: Payer: Self-pay | Admitting: Family Medicine

## 2018-09-05 ENCOUNTER — Other Ambulatory Visit: Payer: Self-pay

## 2018-09-05 ENCOUNTER — Ambulatory Visit (INDEPENDENT_AMBULATORY_CARE_PROVIDER_SITE_OTHER): Payer: 59 | Admitting: Family Medicine

## 2018-09-05 DIAGNOSIS — E786 Lipoprotein deficiency: Secondary | ICD-10-CM | POA: Diagnosis not present

## 2018-09-05 DIAGNOSIS — B20 Human immunodeficiency virus [HIV] disease: Secondary | ICD-10-CM | POA: Diagnosis not present

## 2018-09-05 DIAGNOSIS — J4540 Moderate persistent asthma, uncomplicated: Secondary | ICD-10-CM | POA: Diagnosis not present

## 2018-09-05 MED ORDER — ALBUTEROL SULFATE HFA 108 (90 BASE) MCG/ACT IN AERS: 3.0000 | INHALATION_SPRAY | RESPIRATORY_TRACT | 0 refills | Status: DC | PRN

## 2018-09-05 MED ORDER — BREO ELLIPTA 100-25 MCG/INH IN AEPB: 1.0000 | INHALATION_SPRAY | Freq: Every day | RESPIRATORY_TRACT | 0 refills | Status: DC

## 2018-09-05 NOTE — Progress Notes (Signed)
Name: David Mendez   MRN: [id number]    DOB: Sep 28, 1995   Date:09/05/2018       Progress Note  Subjective  Chief Complaint  Chief Complaint  Patient presents with  . Asthma  . Medication Refill    I connected with  David Mendez  on 09/05/18 at  8:40 AM EDT by a video enabled telemedicine application and verified that I am speaking with the correct person using two identifiers.  I discussed the limitations of evaluation and management by telemedicine and the availability of in person appointments. The patient expressed understanding and agreed to proceed. Staff also discussed with the patient that there may be a patient responsible charge related to this service. Patient Location: at home Provider Location: [facility]   HPI  Asthma: usually mild intermittent, he states over the past 6 months he has noticed symptoms more frequent , about every other week he has noticed increase in wheezing, cough and SOB that may last one week, every other week. No fever or chills. He has gone through inhalers in the past 6 months.   [facility] [place]- he is currently on DOVATO regiment, he had a gap on his coverage and ran out of [place] but is now on a program and is doing well. No side effects of medication.   TMJ: noticed symptoms after wisdom teeth extraction 6 years ago, symptoms not as severe now but still has to pop his jaw occasionally in the mornings.   Low HDL: he has been eating some tree nuts, but does not like fish    Patient Active Problem List   Diagnosis Date Noted  . Inguinal lymphadenitis 07/12/2017  . Chronic left shoulder pain 01/13/2017  . Low HDL (under 40) 07/07/2015  . Allergic contact dermatitis 11/09/2014  . Chronic constipation 11/09/2014  . History of gonorrhea 11/09/2014  . History of giardia infection 11/09/2014  . HIV disease (HCC) 08/06/2014  . Asthma, mild intermittent 08/06/2014  . Seasonal allergic rhinitis  08/31/2006    Past Surgical History:  Procedure Laterality Date  . WISDOM TOOTH EXTRACTION      Family History  Problem Relation Age of Onset  . Asthma Mother   . Allergic rhinitis Mother   . Diabetes Mother   . Asthma Brother        Younger Brother    Social History   Socioeconomic History  . Marital status: Single    Spouse name: Not on file  . Number of children: 0  . Years of education: Not on file  . Highest education level: 12th grade  Occupational History  . Not on file  Social Needs  . Financial resource strain: Very hard  . Food insecurity    Worry: Never true    Inability: Never true  . Transportation needs    Medical: No    Non-medical: No  Tobacco Use  . Smoking status: Never Smoker  . Smokeless tobacco: Never Used  Substance and Sexual Activity  . Alcohol use: Yes    Alcohol/week: 0.0 standard drinks    Comment: very seldom, but binges sometimes  . Drug use: No  . Sexual activity: Yes    Partners: Male    Birth control/protection: Condom  Lifestyle  . Physical activity    Days per week: 0 days    Minutes per session: 0 min  . Stress: Very much  Relationships  . Social Musicianconnections    Talks on  phone: Not on file    Gets together: Not on file    Attends religious service: Not on file    Active member of club or organization: Not on file    Attends meetings of clubs or organizations: Not on file    Relationship status: Not on file  . Intimate partner violence    Fear of current or ex partner: No    Emotionally abused: No    Physically abused: No    Forced sexual activity: No  Other Topics Concern  . 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  albuterol (VENTOLIN HFA) 108 (90 Base) MCG/ACT inhaler, Inhale 3 puffs into the lungs every 4 (four)  hours as needed for wheezing or shortness of breath., Disp: 18 g, Rfl: 0 .  diclofenac (VOLTAREN) 75 MG EC tablet, Take 1 tablet (75 mg total) by mouth 2 (two) times daily., Disp: 30 tablet, Rfl: 2 .  Dolutegravir-lamiVUDine (DOVATO) 50-300 MG TABS, Take 1 tablet by mouth daily for 30 days., Disp: 30 tablet, Rfl: 11 .  doxycycline (VIBRA-TABS) 100 MG tablet, Take 1 tablet (100 mg total) by mouth 2 (two) times daily., Disp: 14 tablet, Rfl: 0 .  fluticasone furoate-vilanterol (BREO ELLIPTA) 100-25 MCG/INH AEPB, Inhale 1 puff into the lungs daily., Disp: 28 each, Rfl: 0 .  tiZANidine (ZANAFLEX) 4 MG tablet, Take 1 tablet (4 mg total) by mouth every 6 (six) hours as needed for muscle spasms., Disp: 30 tablet, Rfl: 2  No Known Allergies  I personally reviewed active problem list, medication list, allergies, family history, social history with the patient/caregiver today.   ROS  Ten systems reviewed and is negative except as mentioned in HPI  Objective  Virtual encounter, vitals not obtained.  There is no height or weight on file to calculate BMI.  Physical Exam  Awake, alert and in no distress   PHQ2/9: Depression screen [facility] 2/[facility] 09/05/2018 07/12/2017 01/11/2017 12/09/2016 10/06/2015  Decreased Interest 0 0 0 0 0  Down, Depressed, Hopeless 0 0 0 0 0  PHQ - 2 Score 0 0 0 0 0  Altered sleeping 0 - - - -  Tired, decreased energy 0 - - - -  Change in appetite 0 - - - -  Feeling bad or failure about yourself  0 - - - -  Trouble concentrating 0 - - - -  Moving slowly or fidgety/restless 0 - - - -  Suicidal thoughts 0 - - - -  PHQ-9 Score 0 - - - -   PHQ-2/9 Result is negative.    Fall Risk: Fall Risk  09/05/2018 07/12/2017 01/11/2017 12/09/2016 10/06/2015  Falls in the past year? 0 No No No No  Number falls in past yr: 0 - - - -  Injury with Fall? 0 - - - -    Assessment & Plan   1. Asthma, moderate persistent, poorly-controlled  - albuterol (VENTOLIN HFA) 108 (90 Base) MCG/ACT  inhaler; Inhale 3 puffs into the lungs every 4 (four) hours as needed for wheezing or shortness of breath.  Dispense: 18 g; Refill: 0 - fluticasone furoate-vilanterol (BREO ELLIPTA) 100-25 MCG/INH AEPB; Inhale 1 puff into the lungs daily.  Dispense: 28 each; Refill: 0 - Ambulatory referral to Chronic Care  Management Services  2. HIV disease ([place])  Under the care of ID and doing well   3. Low HDL (under 40)  Discussed ways to improve HDL    I discussed the assessment and treatment plan with the patient. The patient was provided an opportunity to ask questions and all were answered. The patient agreed with the plan and demonstrated an understanding of the instructions.  The patient was advised to call back or seek an in-person evaluation if the symptoms worsen or if the condition fails to improve as anticipated.  I provided 25 minutes of non-face-to-face time during this encounter.

## 2018-09-11 ENCOUNTER — Encounter: Payer: Self-pay | Admitting: Family Medicine

## 2018-09-15 ENCOUNTER — Ambulatory Visit: Payer: Self-pay | Admitting: *Deleted

## 2018-09-15 DIAGNOSIS — B20 Human immunodeficiency virus [HIV] disease: Secondary | ICD-10-CM

## 2018-09-15 DIAGNOSIS — J4540 Moderate persistent asthma, uncomplicated: Secondary | ICD-10-CM

## 2018-09-15 NOTE — Chronic Care Management (AMB) (Signed)
  Care Management    09/15/2018 Name: David Mendez MRN: [id number] DOB: 08-05-1995  Referred by: Steele Sizer, MD Reason for referral : Chronic Care Management (medication assistance)   [name] is a 23 y.o. year old male who is a primary care patient of Steele Sizer, MD. The care management team was consulted for assistance with chronic disease management and care coordination needs.   Review of patient status, including review of consultants reports, relevant laboratory and other test results, and collaboration with appropriate care team members and the patient's provider was performed as part of comprehensive patient evaluation and provision of care management services.     Goals Addressed   None       [name] was given information about Care Management services today including:  1. Care Management services includes personalized support from designated clinical staff supervised by his physician, including individualized plan of care and coordination with other care providers 2. 24/7 contact phone numbers for assistance for urgent and routine care needs. 3. The patient may stop case management services at any time by phone call to the office staff.  Patient agreed to services and verbal consent obtained.    Follow up plan: Telephone follow up appointment with care management team member scheduled for:09/19/2018  Elliot Gurney, [place] Worker  [facility] Care Management [phone]

## 2018-09-15 NOTE — Patient Instructions (Signed)
Thank you allowing the Chronic Care Man    David Mendez was given information about Care Management services today including:  1. Care Management services includes personalized support from designated clinical staff supervised by his physician, including individualized plan of care and coordination with other care providers 2. 24/7 contact phone numbers for assistance for urgent and routine care needs. The patient may stop case management services at any time by phone call tagement Team to be a part of your care! It was a pleasure speaking with you today!   CCM (Chronic Care Management) Team   Trish Fountain RN, BSN Nurse Care Coordinator  [phone]  Ruben Reason PharmD  Clinical Pharmacist  [phone]   [place], LCSW Clinical Social Worker [phone]  Goals Addressed   None      The patient verbalized understanding of instructions provided today and declined a print copy of patient instruction materials.   Telephone follow up appointment with care management team member scheduled for:09/19/2018

## 2018-09-17 IMAGING — MR MR SHOULDER*L* W/O CM
4 of 5 series · 29 of 40 positions shown · non-contrast
Comparison: Radiographs 09/13/2016

CLINICAL DATA: Left shoulder pain for 6 months.

EXAM:
MRI OF THE LEFT SHOULDER WITHOUT CONTRAST
TECHNIQUE: Multiplanar, multisequence MR imaging of the shoulder was performed.
No intravenous contrast was administered.

[Series 3: T2 fat-sat · axial · 4.0mm · 0.25mm/px · z∈[-41,+41]mm · 8 of 19 slices shown (1 of 3)]
[im 1/19]
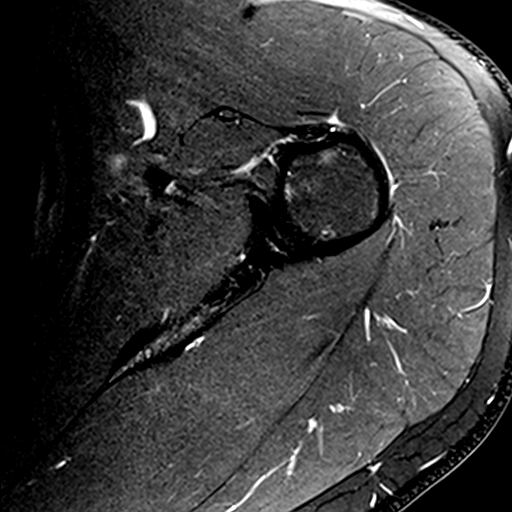
[im 3/19]
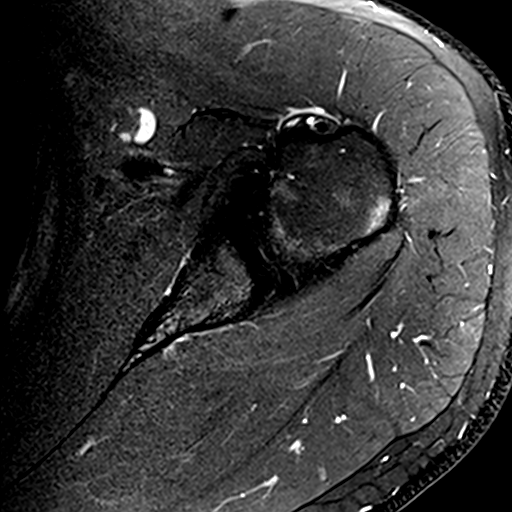
[im 6/19]
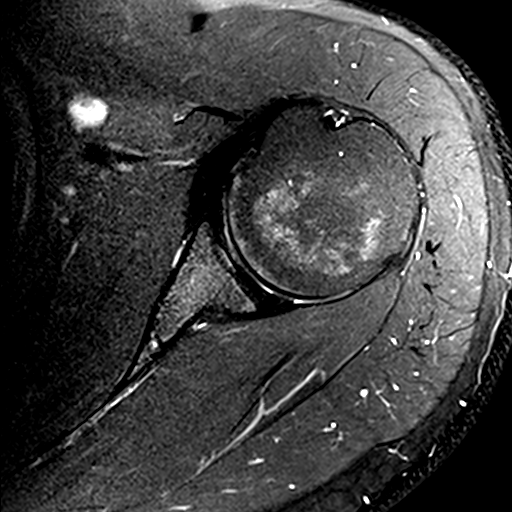
[im 8/19]
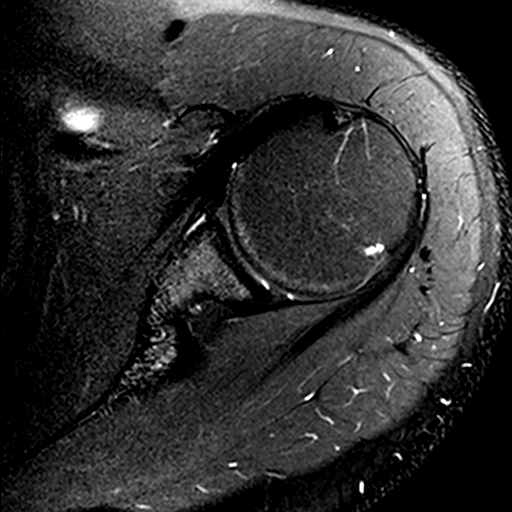
[im 11/19]
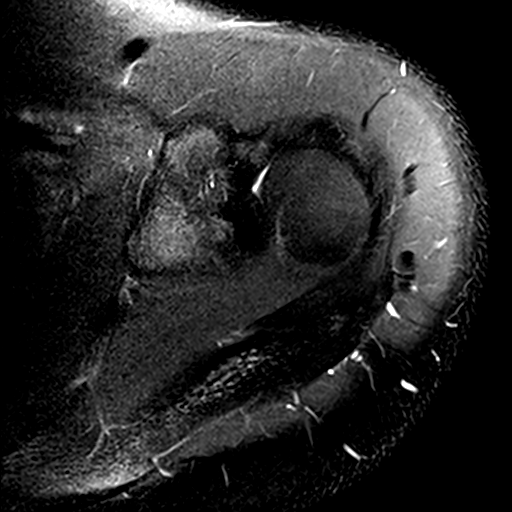
[im 13/19]
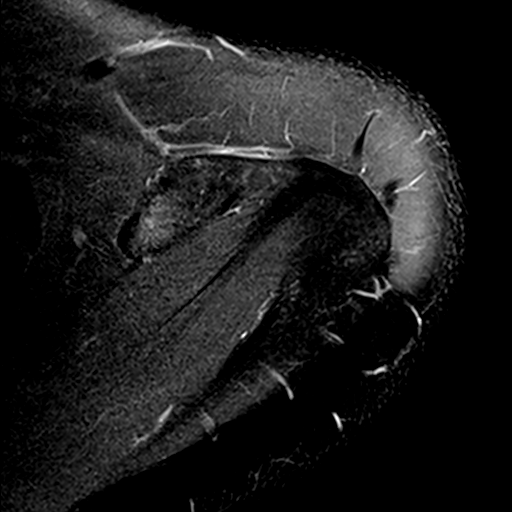
[im 16/19]
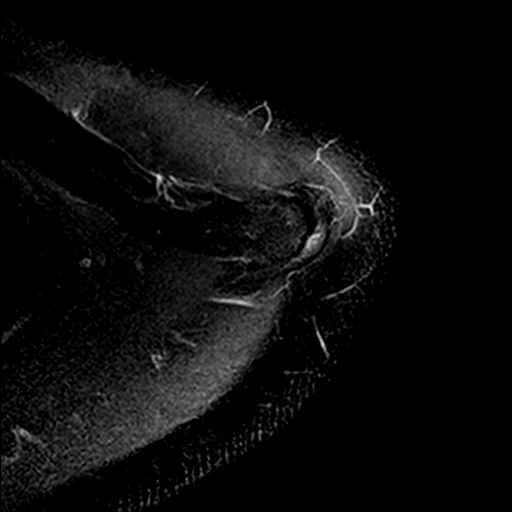
[im 19/19]
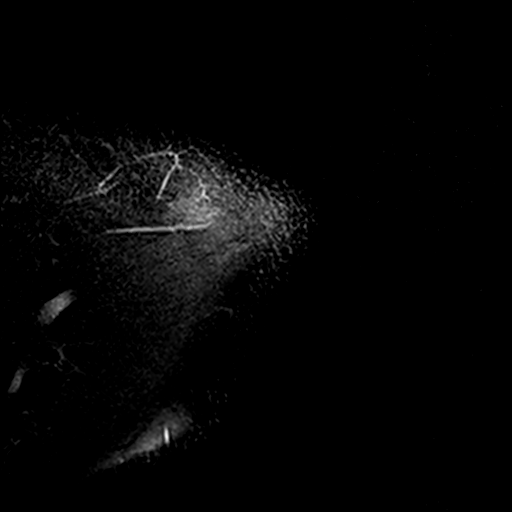

[Series 4: T2 fat-sat · oblique · 4.0mm · 0.55mm/px · 9 of 20 slices shown (2 of 3)]
[im 1/20]
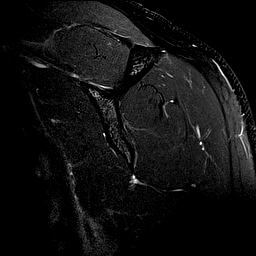
[im 3/20]
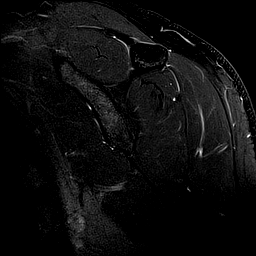
[im 5/20]
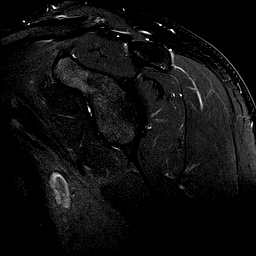
[im 8/20]
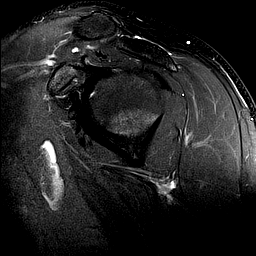
[im 10/20]
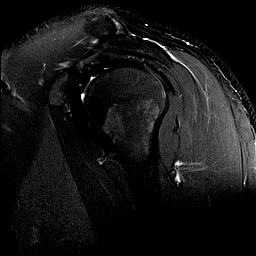
[im 12/20]
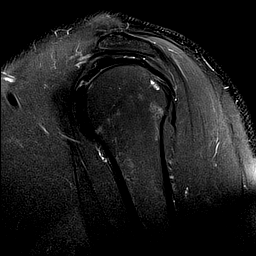
[im 15/20]
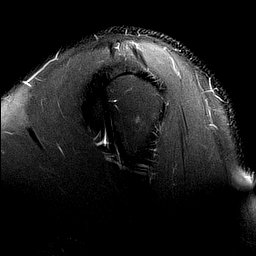
[im 17/20]
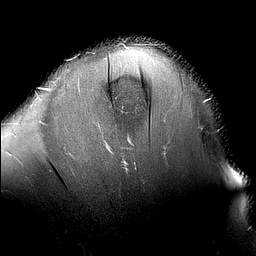
[im 20/20]
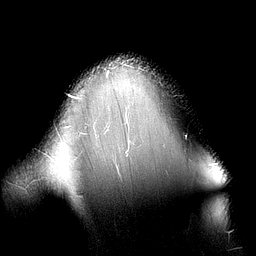

[Series 6: T2 fat-sat · oblique · 4.0mm · 0.55mm/px · 5 of 15 slices shown (3 of 3)]
[im 1/15]
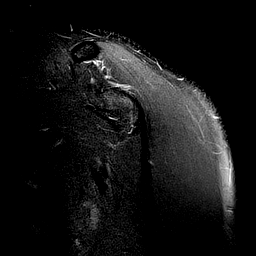
[im 3/15]
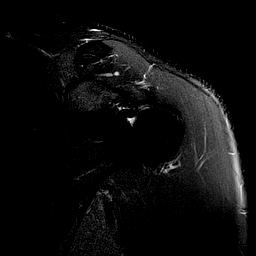
[im 5/15]
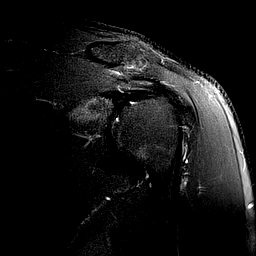
[im 8/15]
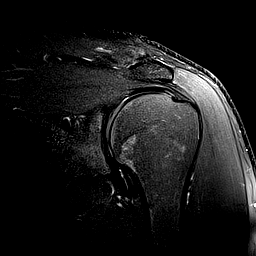
[im 12/15]
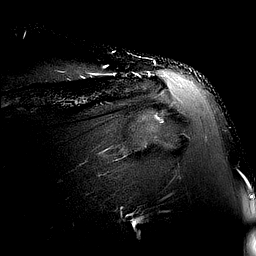

[Series 7: PD · oblique · 4.0mm · 0.27mm/px · 7 of 15 slices shown]
[im 1/15]
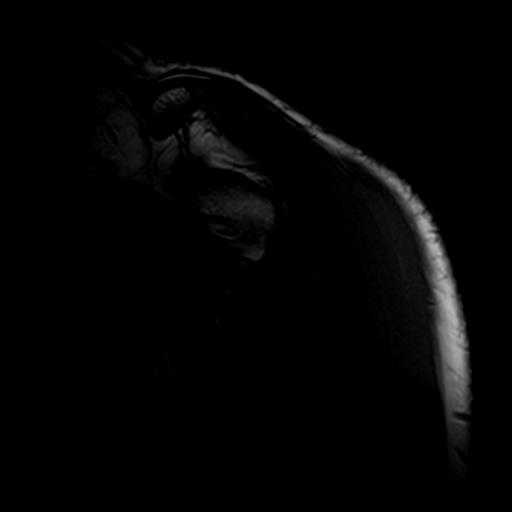
[im 3/15]
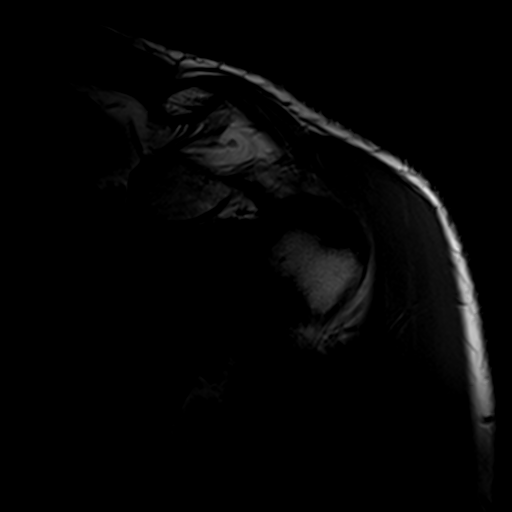
[im 5/15]
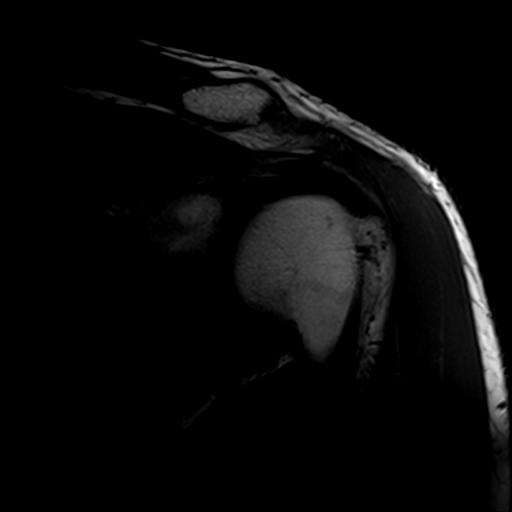
[im 8/15]
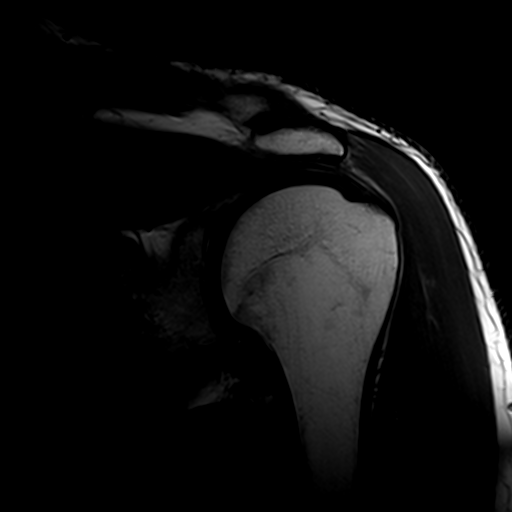
[im 10/15]
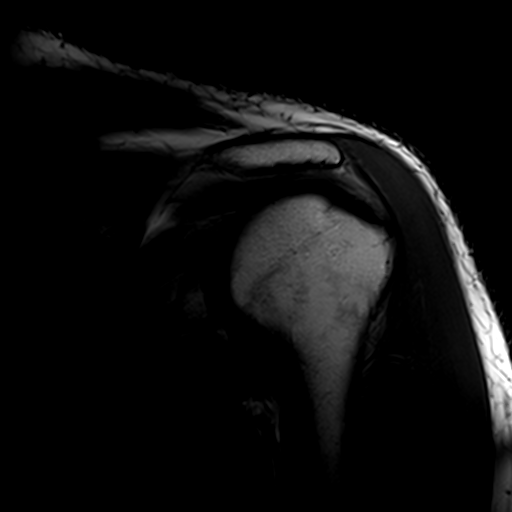
[im 12/15]
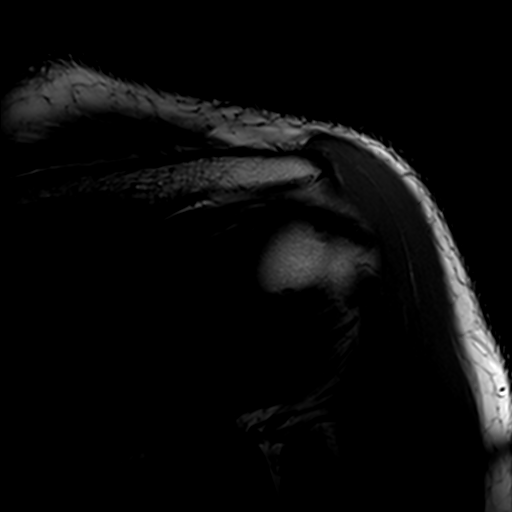
[im 15/15]
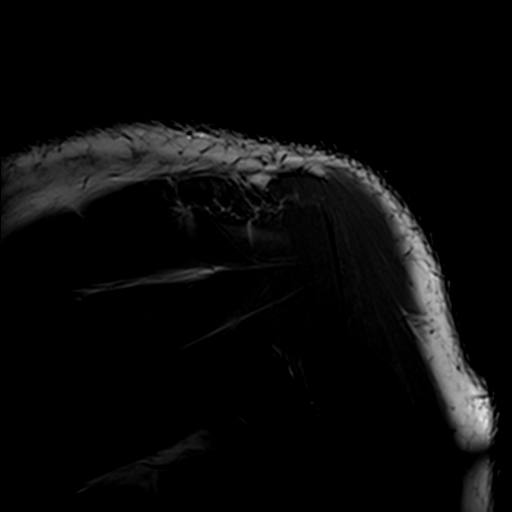

[29 of 40 positions shown; findings below may reference images not displayed]

FINDINGS: Rotator cuff: Moderate supraspinatus tendinopathy/ tendinosis at the
anterior attachment site. Probable interstitial tears but no partial
or full-thickness tear. The infraspinatus and subscapularis tendons
are intact.

Muscles:  Normal

Biceps long head:  Intact

Acromioclavicular Joint: No degenerative changes. Type 1-2 acromion.
No lateral downsloping or undersurface spurring.

Glenohumeral Joint: No degenerative changes or joint effusion. There
is thickening of the capsular structures in the axillary recess
which can be seen with adhesive capsulitis or synovitis.

Labrum:  Intact

Bones: No acute bony findings. Minimal subchondral cystic type
change involving the posterior aspect of the humeral head near the
infraspinatus attachment site.

Other: No subacromial/subdeltoid fluid collections to suggest
bursitis.
IMPRESSION: 1. Supraspinatus tendinopathy/tendinosis with probable interstitial
tears but no partial or full-thickness tear.
2. There is thickening of the capsular structures in the axillary
recess which can be seen with adhesive capsulitis or synovitis.
3. Intact long head biceps tendon and glenoid labrum.
4. No findings for bony impingement.

## 2018-09-19 ENCOUNTER — Ambulatory Visit: Payer: Self-pay | Admitting: Pharmacist

## 2018-09-19 DIAGNOSIS — J4540 Moderate persistent asthma, uncomplicated: Secondary | ICD-10-CM

## 2018-09-19 DIAGNOSIS — J4521 Mild intermittent asthma with (acute) exacerbation: Secondary | ICD-10-CM

## 2018-09-19 NOTE — Patient Instructions (Signed)
Thank you for taking the time to speak with me today!   Please call a member of the CCM (Chronic Care Management) Team with any questions or case management needs:   Vanetta Mulders, BSN Nurse Care Coordinator  [phone]  Ruben Reason, PharmD  Clinical Pharmacist  [phone]  [place], LCSW Clinical Social Worker [phone]  Goals Addressed   None

## 2018-09-19 NOTE — Chronic Care Management (AMB) (Signed)
  Care Management   Note  09/19/2018 Name: Danford KNOPE MRN: [id number] DOB: 1996/01/16  Subjective: Jaysun Wessels is 23 year old male patient of Dr. Ancil Boozer referred to CCM clinical pharmacist for medication assistance. HIPAA identifiers verified.  Medications: Adair Patter, Proventil  Mr. Rodelo was previously on worker's [employer] but is now uninsured/self pay with no prescription coverage. He has a new job but no insurance right now.   Medication Assistance:  CCM pharmacist can assist Mr. Scales in applying for medication assistance programs Breo Ellipta and Proventil until his prescription insurance starts through his new job, in which case he will be able to use manufacturers coupons.   Patient Assistance Programs: 1) Breo Ellipta made by [place]        2)  Proventil made by [employer]    Additional medication assistance options reviewed with patient as warranted:  Visual merchandiser- when patient has prescription insurance in approximately 90 days  Plan: Patient is to provide proof of out of pocket expenditures and proof of household income as necessary.    Follow up: Telephone follow up appointment with care management team member scheduled for: 2 weeks telephone with PharmD  Ruben Reason, PharmD Clinical Pharmacist [facility] [phone]

## 2018-10-03 ENCOUNTER — Telehealth: Payer: Self-pay

## 2018-10-05 ENCOUNTER — Telehealth: Payer: Self-pay

## 2018-10-08 ENCOUNTER — Ambulatory Visit: Payer: Self-pay | Admitting: Infectious Disease

## 2018-10-09 ENCOUNTER — Ambulatory Visit: Payer: Self-pay | Admitting: Pharmacist

## 2018-10-09 DIAGNOSIS — J4521 Mild intermittent asthma with (acute) exacerbation: Secondary | ICD-10-CM

## 2018-10-09 DIAGNOSIS — J4540 Moderate persistent asthma, uncomplicated: Secondary | ICD-10-CM

## 2018-10-09 NOTE — Chronic Care Management (AMB) (Signed)
  Care Management   Note  10/09/2018 Name: David Mendez MRN: [id number] DOB: August 27, 1995  Subjective: David Mendez is 23 year old male patient of Dr. Ancil Boozer referred to CCM clinical pharmacist for medication assistance. Follow up outreach call today to verify he received application materials.  HIPAA identifiers verified.  Medications: Adair Patter, Proventil  David Mendez was previously on worker's [employer] but is now uninsured/self pay with no prescription coverage. He has a new job but no insurance right now.   Medication Assistance:  CCM pharmacist can assist David Mendez in applying for medication assistance programs Breo Ellipta and Proventil until his prescription insurance starts through his new job, in which case he will be able to use manufacturers coupons.   Patient Assistance Programs: 1) Breo Ellipta made by [place]        2)  Proventil made by [employer]    Additional medication assistance options reviewed with patient as warranted:  Visual merchandiser- when patient has prescription insurance in approximately 90 days  Plan: Patient is to provide proof of out of pocket expenditures and proof of household income as necessary.    Follow up: No further follow up required: patient will be transitioned to [facility] Med Management clinic   Ruben Reason, PharmD Clinical Pharmacist [facility] [phone]

## 2018-10-25 ENCOUNTER — Telehealth: Payer: Self-pay | Admitting: Pharmacy Technician

## 2018-10-25 NOTE — Telephone Encounter (Signed)
Provided patient with new patient packet to obtain Medication Management Clinic services.  David Mendez Care Manager Medication Management Clinic  

## 2018-11-22 ENCOUNTER — Ambulatory Visit (INDEPENDENT_AMBULATORY_CARE_PROVIDER_SITE_OTHER): Payer: Self-pay | Admitting: Infectious Disease

## 2018-11-22 ENCOUNTER — Encounter: Payer: Self-pay | Admitting: Infectious Disease

## 2018-11-22 DIAGNOSIS — B20 Human immunodeficiency virus [HIV] disease: Secondary | ICD-10-CM

## 2018-11-22 DIAGNOSIS — Z8619 Personal history of other infectious and parasitic diseases: Secondary | ICD-10-CM

## 2018-11-22 NOTE — Progress Notes (Signed)
Virtual Visit via Telephone Note  I connected with David Mendez on 11/22/18 at  8:45 AM EDT by telephone and verified that I am speaking with the correct person using two identifiers.  Location: Patient:  Home Provider: Home   I discussed the limitations, risks, security and privacy concerns of performing an evaluation and management service by telephone and the availability of in person appointments. I also discussed with the patient that there may be a patient responsible charge related to this service. The patient expressed understanding and agreed to proceed.  Chief complaint: Follow-up for HIV disease   History of Present Illness:   David Mendez is a 23 year old African-American man who has typically controlled his HIV though he had a relapse and became viremic this past year.  When I last saw him his viral load was in the 400s due to having missed some of his medications.  He was on [place] but I elected to change him to [facility] and he has tolerated Dovato without difficulty.  Unfortunately he has not yet renewed his HIV medication assistance program which is due to run out at the end of this month on September 30.  He is currently working a job from 9 AM to 6 PM at night and so it is difficult for him to make appointments for labs or in person visits.  However I feel very strongly he needs to have labs done and I am ordering them for next week and arranging for him to have a visit with me later in the month.  I would like for him when he comes for his lab visit to meet with Juliann Pulse and put the paperwork for his HIV medication assistance program.  As another bridging mechanism I intend to have samples of Dovato for him to give to him in person when he sees me at the end of the month.  He does need vaccination for flu as well.   Observations/Objective:  HIV disease is hopefully well controlled but he definitely is motivated to get his assistance program renewed and I worry about a gap in  meds to allow her to make sure he has samples  Assessment and Plan:  HIV disease check labs next week and have him meet with Juliann Pulse to renew H and AP which I believe he can do over the phone as well if he has his pain studies that can be sent.  I will plan on seeing him at the end of the month and having samples of Dovato to give to him in case there is going to be a gap availability of his Dovato  He needs vaccination for the flu in life see him in person I will also check him for STIs at genital and extra genital sites.    Follow Up Instructions:    I discussed the assessment and treatment plan with the patient. The patient was provided an opportunity to ask questions and all were answered. The patient agreed with the plan and demonstrated an understanding of the instructions.   The patient was advised to call back or seek an in-person evaluation if the symptoms worsen or if the condition fails to improve as anticipated.  I provided 15 minutes of non-face-to-face time during this encounter.   Alcide Evener, MD

## 2018-11-29 ENCOUNTER — Other Ambulatory Visit: Payer: Self-pay

## 2018-12-02 ENCOUNTER — Other Ambulatory Visit: Payer: Self-pay | Admitting: Family Medicine

## 2018-12-02 DIAGNOSIS — J4540 Moderate persistent asthma, uncomplicated: Secondary | ICD-10-CM

## 2018-12-04 ENCOUNTER — Other Ambulatory Visit: Payer: Self-pay

## 2018-12-12 ENCOUNTER — Ambulatory Visit: Payer: Self-pay | Admitting: Infectious Disease

## 2018-12-12 ENCOUNTER — Telehealth: Payer: Self-pay

## 2018-12-12 NOTE — Telephone Encounter (Signed)
Called patient regarding missed appointment with Dr. Tommy Medal today. Patient states that he does not have any transportation to come into the office, and is unsure how he will be able to come to the office. Patient states that he is doing well on medication and recently received a refill two weeks ago.  Verbalized to patient the importance of communicating with our office to make sure he stays in care. Patient understands and will make sure to do a better job at this in the future.  Will have financial counselor call patient today to go ahead and renew ADAP to avoid lapse in coverage. Will route message to MD to advise if it is okay for patient to be seen in late October since there is no open appointment slots in the meantime or if he can see pharmacy. Once appointment is scheduled Juliann Pulse, office manager will set up transportation. [place]

## 2018-12-13 NOTE — Telephone Encounter (Signed)
I am ok with October but I am very worried about him having a gap in his meds.  If he cannot pickup the samples maybe a ViiV assistance program could be used for [employer] to mail him his DOvato until ADAP renews

## 2018-12-13 NOTE — Telephone Encounter (Signed)
Attempted to call patient to schedule an appointment with Dr. Tommy Medal in late October with labs two weeks before. Unable to reach patient at this time left voicemail requesting patient call office back. Once appointments are made will notify Juliann Pulse, Glass blower/designer to set up trasportation. [place]

## 2018-12-17 ENCOUNTER — Encounter: Payer: Self-pay | Admitting: Infectious Disease

## 2018-12-21 ENCOUNTER — Encounter: Payer: Self-pay | Admitting: Family Medicine

## 2018-12-21 ENCOUNTER — Encounter (INDEPENDENT_AMBULATORY_CARE_PROVIDER_SITE_OTHER): Payer: Self-pay | Admitting: Family Medicine

## 2018-12-21 ENCOUNTER — Other Ambulatory Visit: Payer: Self-pay

## 2018-12-21 NOTE — Progress Notes (Signed)
CMA was able to speak to patient, but I called patient before 4, right at 4 and no answer

## 2018-12-28 ENCOUNTER — Ambulatory Visit: Payer: Self-pay | Admitting: Family Medicine

## 2018-12-31 ENCOUNTER — Other Ambulatory Visit: Payer: Self-pay

## 2019-01-01 ENCOUNTER — Telehealth: Payer: Self-pay

## 2019-01-01 NOTE — Telephone Encounter (Signed)
Called and left message for patient to call back to reschedule lab appointment

## 2019-01-10 IMAGING — MR MR CERVICAL SPINE W/O CM
4 of 5 series · 31 of 48 positions shown · non-contrast
Comparison: None.

CLINICAL DATA: Neck pain

EXAM:
MRI CERVICAL SPINE WITHOUT CONTRAST
TECHNIQUE: Multiplanar, multisequence MR imaging of the cervical spine was
performed. No intravenous contrast was administered.

[Series 2: T2 · sagittal · 3.0mm · 0.41mm/px · 8 of 15 slices shown (1 of 2)]
[im 1/15]
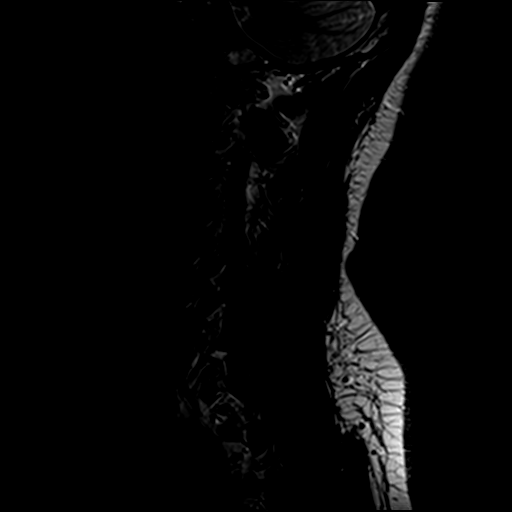
[im 3/15]
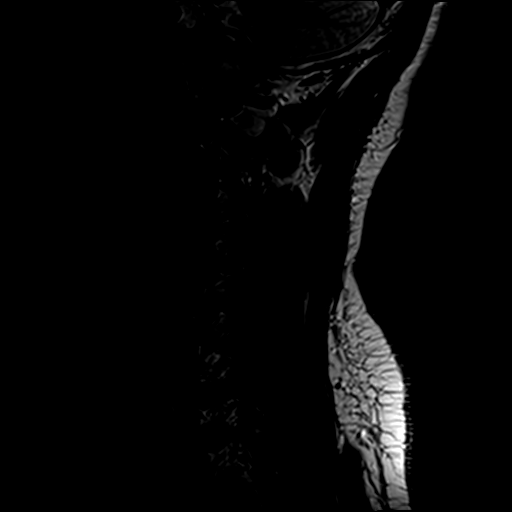
[im 5/15]
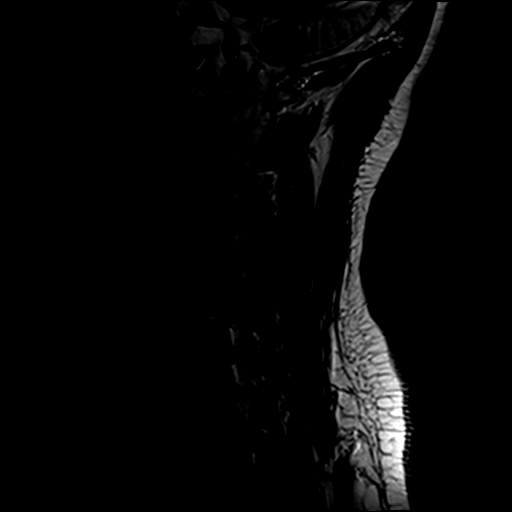
[im 7/15]
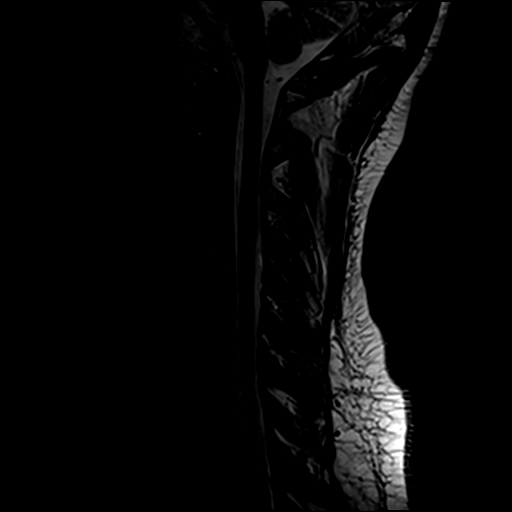
[im 9/15]
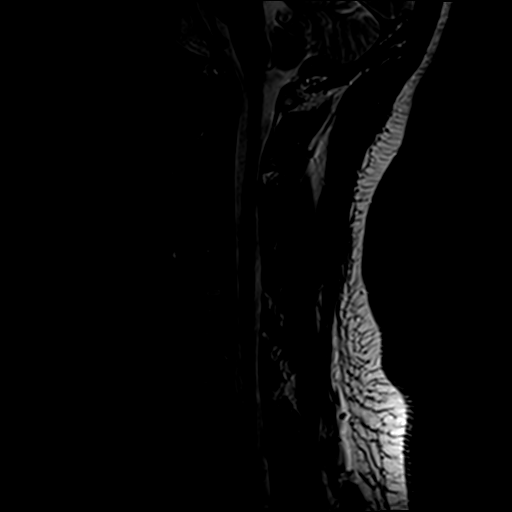
[im 11/15]
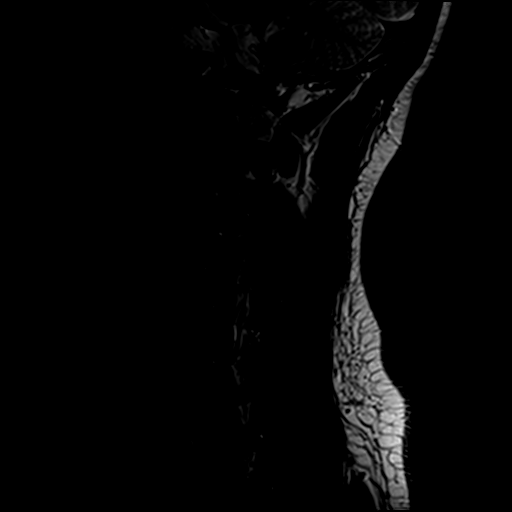
[im 13/15]
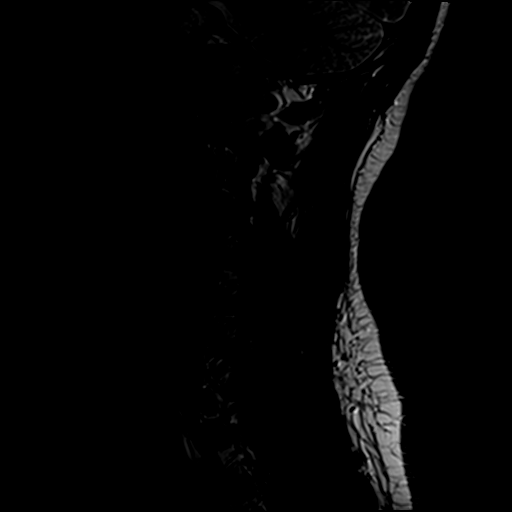
[im 15/15]
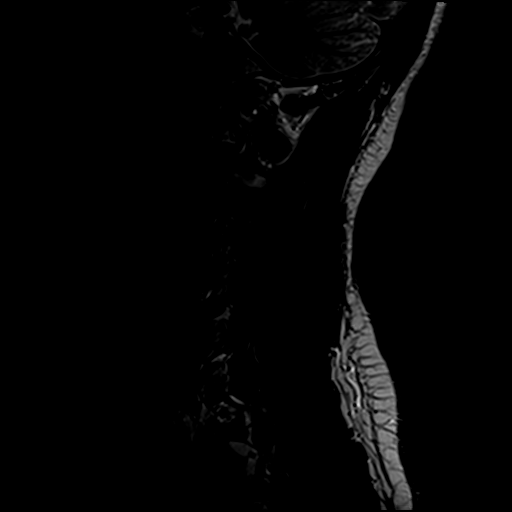

[Series 3: STIR · sagittal · 3.0mm · 0.82mm/px · 7 of 15 slices shown]
[im 1/15]
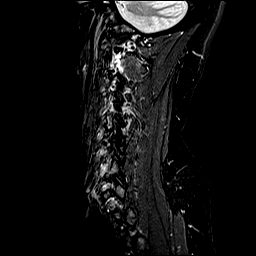
[im 3/15]
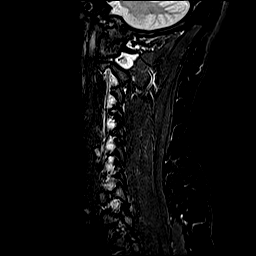
[im 5/15]
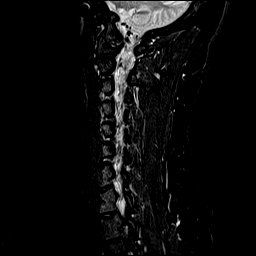
[im 8/15]
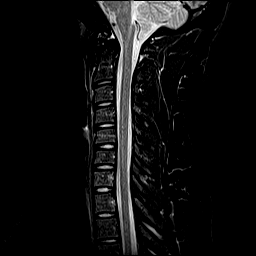
[im 10/15]
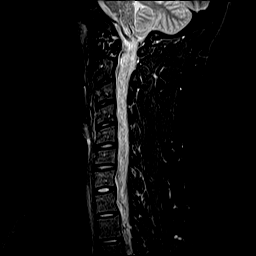
[im 12/15]
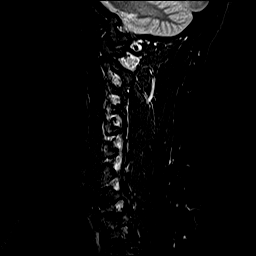
[im 15/15]
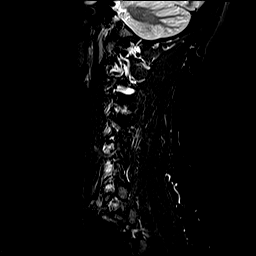

[Series 4: T1 · sagittal · 3.0mm · 0.41mm/px · 7 of 15 slices shown]
[im 1/15]
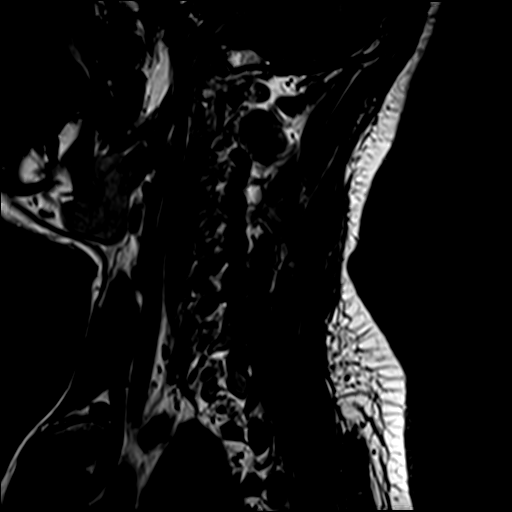
[im 3/15]
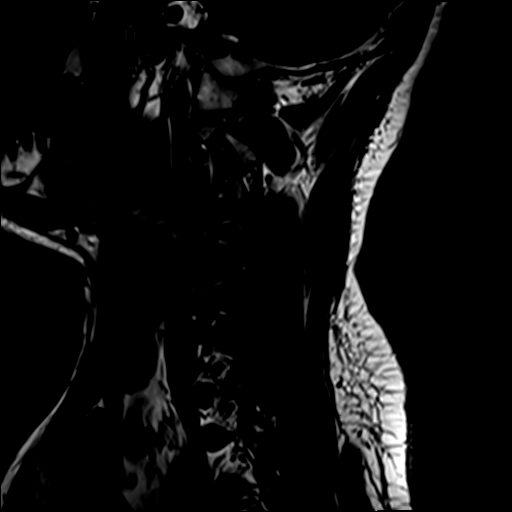
[im 5/15]
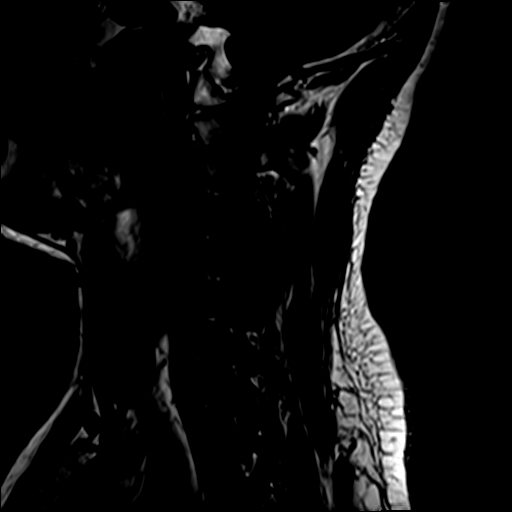
[im 8/15]
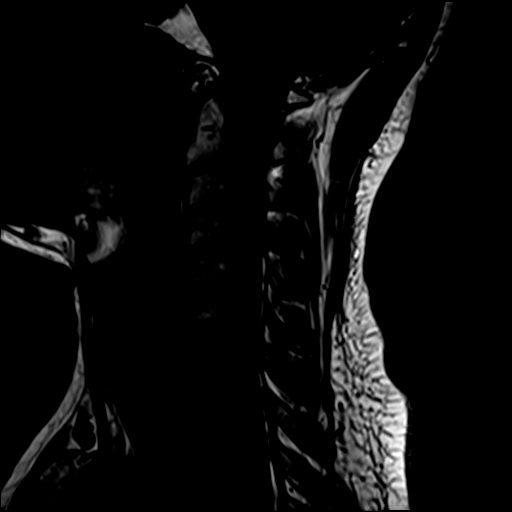
[im 10/15]
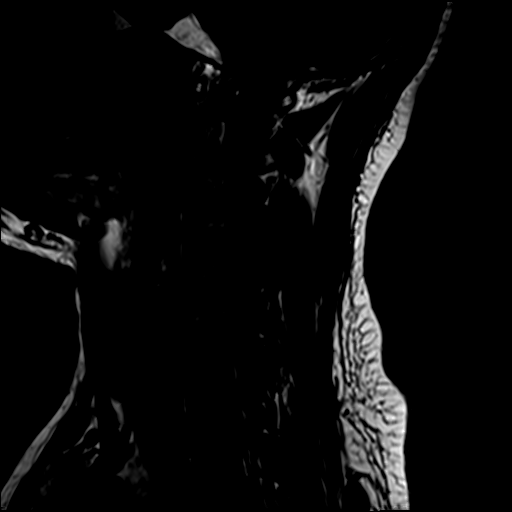
[im 12/15]
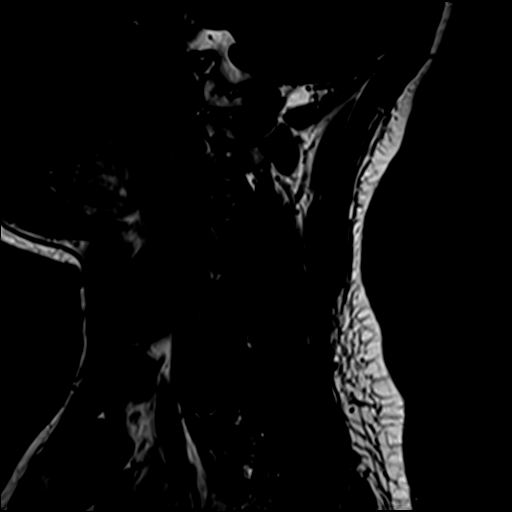
[im 15/15]
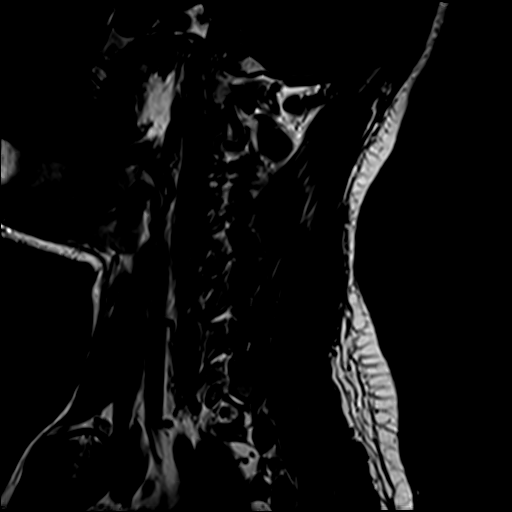

[Series 5: T2 · axial · 3.0mm · 0.70mm/px · z∈[-69,+25]mm · 9 of 26 slices shown (2 of 2)]
[im 1/26]
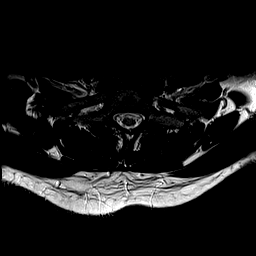
[im 5/26]
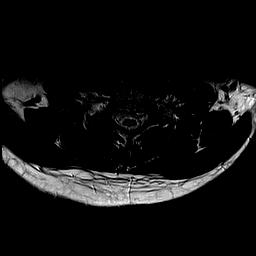
[im 9/26]
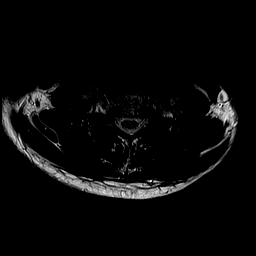
[im 11/26]
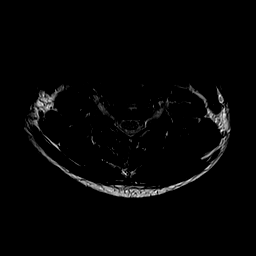
[im 13/26]
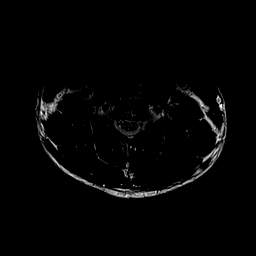
[im 15/26]
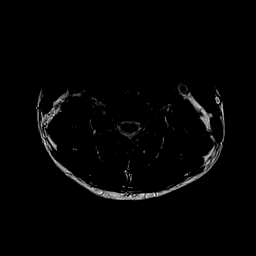
[im 17/26]
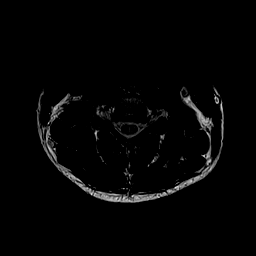
[im 21/26]
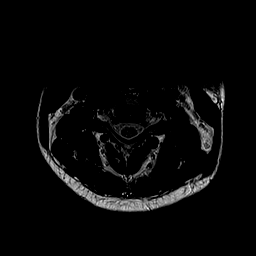
[im 26/26]
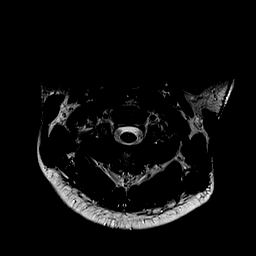

[31 of 48 positions shown; findings below may reference images not displayed]

FINDINGS: Alignment: Normal

Vertebrae: Normal

Cord: Normal signal morphology of the cord.  No cord compression

Posterior Fossa, vertebral arteries, paraspinal tissues: Normal

Disc levels:

Normal disc spaces. Negative for disc degeneration or disc
protrusion. Normal spinal canal and neural foramina.
IMPRESSION: Negative MRI cervical spine

## 2019-01-15 ENCOUNTER — Encounter: Payer: Self-pay | Admitting: Infectious Disease

## 2019-02-25 ENCOUNTER — Encounter: Payer: Self-pay | Admitting: Infectious Disease

## 2019-02-25 NOTE — Progress Notes (Unsigned)
Patient ID: David Mendez, male   DOB: Nov 20, 1995, 23 y.o.   MRN: [id number] Called patient to schedule follow up  Left message to call for appointment    [place]

## 2019-03-19 ENCOUNTER — Telehealth: Payer: Self-pay | Admitting: Orthopaedic Surgery

## 2019-03-19 NOTE — Telephone Encounter (Signed)
error 

## 2019-04-26 ENCOUNTER — Other Ambulatory Visit: Payer: Self-pay

## 2019-04-26 DIAGNOSIS — B20 Human immunodeficiency virus [HIV] disease: Secondary | ICD-10-CM

## 2019-04-26 LAB — T-HELPER CELL (CD4) - (RCID CLINIC ONLY)
CD4 % Helper T Cell: 41 % (ref 33–65)
CD4 T Cell Abs: 1533 /uL (ref 400–1790)

## 2019-04-30 ENCOUNTER — Encounter: Payer: Self-pay | Admitting: Family Medicine

## 2019-04-30 ENCOUNTER — Other Ambulatory Visit: Payer: Self-pay

## 2019-04-30 ENCOUNTER — Ambulatory Visit (INDEPENDENT_AMBULATORY_CARE_PROVIDER_SITE_OTHER): Payer: Self-pay | Admitting: Orthopaedic Surgery

## 2019-04-30 ENCOUNTER — Encounter: Payer: Self-pay | Admitting: Orthopaedic Surgery

## 2019-04-30 DIAGNOSIS — M542 Cervicalgia: Secondary | ICD-10-CM

## 2019-04-30 DIAGNOSIS — G8929 Other chronic pain: Secondary | ICD-10-CM

## 2019-04-30 DIAGNOSIS — M25512 Pain in left shoulder: Secondary | ICD-10-CM

## 2019-04-30 LAB — COMPLETE METABOLIC PANEL WITH GFR
AG Ratio: 2 (calc) (ref 1.0–2.5)
ALT: 42 U/L (ref 9–46)
AST: 27 U/L (ref 10–40)
Albumin: 4.5 g/dL (ref 3.6–5.1)
Alkaline phosphatase (APISO): 81 U/L (ref 36–130)
BUN: 16 mg/dL (ref 7–25)
CO2: 30 mmol/L (ref 20–32)
Calcium: 9.6 mg/dL (ref 8.6–10.3)
Chloride: 106 mmol/L (ref 98–110)
Creat: 1.18 mg/dL (ref 0.60–1.35)
GFR, Est African American: 99 mL/min/{1.73_m2} (ref >=60)
GFR, Est Non African American: 85 mL/min/{1.73_m2} (ref >=60)
Globulin: 2.2 g/dL (calc) (ref 1.9–3.7)
Glucose, Bld: 87 mg/dL (ref 65–99)
Potassium: 4.1 mmol/L (ref 3.5–5.3)
Sodium: 143 mmol/L (ref 135–146)
Total Bilirubin: 0.8 mg/dL (ref 0.2–1.2)
Total Protein: 6.7 g/dL (ref 6.1–8.1)

## 2019-04-30 LAB — CBC WITH DIFFERENTIAL/PLATELET
Absolute Monocytes: 488 cells/uL (ref 200–950)
Basophils Absolute: 68 cells/uL (ref 0–200)
Basophils Relative: 0.9 %
Eosinophils Absolute: 353 cells/uL (ref 15–500)
Eosinophils Relative: 4.7 %
HCT: 44.7 % (ref 38.5–50.0)
Hemoglobin: 15.8 g/dL (ref 13.2–17.1)
Lymphs Abs: 3855 cells/uL (ref 850–3900)
MCH: 33.1 pg — ABNORMAL HIGH (ref 27.0–33.0)
MCHC: 35.3 g/dL (ref 32.0–36.0)
MCV: 93.5 fL (ref 80.0–100.0)
MPV: 10.7 fL (ref 7.5–12.5)
Monocytes Relative: 6.5 %
Neutro Abs: 2738 cells/uL (ref 1500–7800)
Neutrophils Relative %: 36.5 %
Platelets: 243 10*3/uL (ref 140–400)
RBC: 4.78 10*6/uL (ref 4.20–5.80)
RDW: 13 % (ref 11.0–15.0)
Total Lymphocyte: 51.4 %
WBC: 7.5 10*3/uL (ref 3.8–10.8)

## 2019-04-30 LAB — HIV-1 RNA QUANT-NO REFLEX-BLD
HIV 1 RNA Quant: <20 copies/mL
HIV-1 RNA Quant, Log: <1.3 Log copies/mL

## 2019-04-30 LAB — SYPHILIS: RPR W/REFLEX TO RPR TITER AND TREPONEMAL ANTIBODIES, TRADITIONAL SCREENING AND DIAGNOSIS ALGORITHM: RPR Ser Ql: NONREACTIVE

## 2019-04-30 MED ORDER — CYCLOBENZAPRINE HCL 5 MG PO TABS: 5.0000 mg | ORAL_TABLET | Freq: Three times a day (TID) | ORAL | 3 refills | Status: DC | PRN

## 2019-04-30 NOTE — Progress Notes (Signed)
Office Visit Note   Patient: David Mendez           Date of Birth: 09-22-1995           MRN: [id number] Visit Date: 04/30/2019              Requested by: Alba Cory, MD [address] [place],  [place] [address] PCP: Alba Cory, MD   Assessment & Plan: Visit Diagnoses:  1. Chronic left shoulder pain   2. Neck pain     Plan: My impression is chronic muscular neck and shoulder pain.  MRI studies have been negative thus far.  I recommend physical therapy to work on the muscle pain at the trigger points.  Prescription for Flexeril sent in today.  If the patient does not get much relief from physical therapy may need to consider having him to a pain clinic. Total face to face encounter time was greater than 25 minutes and over half of this time was spent in counseling and/or coordination of care. This note is not being shared with the patient for the following reason: To respect privacy (The patient or proxy has requested that the information not be shared).  Follow-Up Instructions: Return if symptoms worsen or fail to improve.   Orders:  Orders Placed This Encounter  Procedures  . Ambulatory referral to Physical Therapy   Meds ordered this encounter  Medications  . cyclobenzaprine (FLEXERIL) 5 MG tablet    Sig: Take 1-2 tablets (5-10 mg total) by mouth 3 (three) times daily as needed for muscle spasms.    Dispense:  30 tablet    Refill:  3      Procedures: No procedures performed   Clinical Data: No additional findings.   Subjective: Chief Complaint  Patient presents with  . Left Shoulder - Pain  . Neck - Pain    David Mendez is a 24 year old returns today for chronic and upper back pain and low back pain.  We saw him back in 18-19 these issues.  He states that he has continued to have pain.  He is currently not working.  He has done some physical therapy for the shoulder which.  He received an injection with Dr. Alvester Morin which did not help.  He had a  C-spine MRI in 2019 which was negative and left shoulder MRI 2018 which showed some mild tendinopathy without any real pathology.  He states that he has discomfort and pain that starts at the base of the skull that radiates down to the rhomboids and into the back.  Denies any radicular symptoms or numbness and tingling.  He states that he is in chronic discomfort and pain.   Review of Systems  Constitutional: Negative.   All other systems reviewed and are negative.    Objective: Vital Signs: There were no vitals taken for this visit.  Physical Exam Vitals and nursing note reviewed.  Constitutional:      Appearance: He is well-developed.  HENT:     Head: Normocephalic and atraumatic.  Eyes:     Pupils: Pupils are equal, round, and reactive to light.  Pulmonary:     Effort: Pulmonary effort is normal.  Abdominal:     Palpations: Abdomen is soft.  Musculoskeletal:        General: Normal range of motion.     Cervical back: Neck supple.  Skin:    General: Skin is warm.  Neurological:     Mental Status: He is alert and oriented  to person, place, and time.  Psychiatric:        Behavior: Behavior normal.        Thought Content: Thought content normal.        Judgment: Judgment normal.     Ortho Exam Cervical spine exam shows no palpable defects or step-offs or abnormalities.  He has normal range of motion with moderate discomfort.  He localizes discomfort in the paraspinal musculature.  He also feels moderate pain in the rhomboid region.  Shoulder exam is unremarkable. Specialty Comments:  No specialty comments available.  Imaging: No results found.   PMFS History: Patient Active Problem List   Diagnosis Date Noted  . Inguinal lymphadenitis 07/12/2017  . Chronic left shoulder pain 01/13/2017  . Low HDL (under 40) 07/07/2015  . Allergic contact dermatitis 11/09/2014  . Chronic constipation 11/09/2014  . History of gonorrhea 11/09/2014  . History of giardia infection  11/09/2014  . HIV disease ([place]) 08/06/2014  . Asthma, mild intermittent 08/06/2014  . Seasonal allergic rhinitis 08/31/2006   Past Medical History:  Diagnosis Date  . Acute pain of left shoulder 01/11/2017  . Allergy   . Asthma   . Asthma, chronic 08/06/2014  . Chlamydia 06/08/2015  . History of giardia infection   . HIV antibody positive ([place])   . HIV disease ([place]) 08/06/2014  . Industrial dermatitis   . Inguinal lymphadenitis 07/12/2017  . Loss of weight     Family History  Problem Relation Age of Onset  . Asthma Mother   . Allergic rhinitis Mother   . Diabetes Mother   . Asthma Brother        Younger Brother    Past Surgical History:  Procedure Laterality Date  . WISDOM TOOTH EXTRACTION     Social History   Occupational History  . Not on file  Tobacco Use  . Smoking status: Never Smoker  . Smokeless tobacco: Never Used  Substance and Sexual Activity  . Alcohol use: Yes    Alcohol/week: 0.0 standard drinks    Comment: very seldom, but binges sometimes  . Drug use: No  . Sexual activity: Yes    Partners: Male    Birth control/protection: Condom

## 2019-05-02 ENCOUNTER — Ambulatory Visit: Payer: Self-pay | Admitting: Physical Therapy

## 2019-05-08 ENCOUNTER — Ambulatory Visit: Payer: Self-pay | Admitting: Physical Therapy

## 2019-05-15 ENCOUNTER — Ambulatory Visit (INDEPENDENT_AMBULATORY_CARE_PROVIDER_SITE_OTHER): Payer: Self-pay | Admitting: Infectious Disease

## 2019-05-15 ENCOUNTER — Other Ambulatory Visit: Payer: Self-pay

## 2019-05-15 DIAGNOSIS — B20 Human immunodeficiency virus [HIV] disease: Secondary | ICD-10-CM

## 2019-05-16 NOTE — Progress Notes (Signed)
No showed

## 2019-05-21 ENCOUNTER — Other Ambulatory Visit: Payer: Self-pay

## 2019-05-21 ENCOUNTER — Ambulatory Visit (INDEPENDENT_AMBULATORY_CARE_PROVIDER_SITE_OTHER): Payer: Self-pay | Admitting: Physical Therapy

## 2019-05-21 ENCOUNTER — Encounter: Payer: Self-pay | Admitting: Physical Therapy

## 2019-05-21 DIAGNOSIS — M546 Pain in thoracic spine: Secondary | ICD-10-CM

## 2019-05-21 DIAGNOSIS — M25512 Pain in left shoulder: Secondary | ICD-10-CM

## 2019-05-21 DIAGNOSIS — M25612 Stiffness of left shoulder, not elsewhere classified: Secondary | ICD-10-CM

## 2019-05-21 DIAGNOSIS — G8929 Other chronic pain: Secondary | ICD-10-CM

## 2019-05-21 DIAGNOSIS — M542 Cervicalgia: Secondary | ICD-10-CM

## 2019-05-21 NOTE — Therapy (Signed)
[facility] Physical Therapy [address] [place], [place], [address] Phone: [phone]   Fax:  [phone]  Physical Therapy Evaluation  Patient Details  Name: David Mendez MRN: [id number] Date of Birth: 12-11-1995 No data recorded  Encounter Date: 05/21/2019  PT End of Session - 05/21/19 0910    Visit Number  1    Number of Visits  8    Date for PT Re-Evaluation  06/21/19    PT Start Time  0800    PT Stop Time  0846    PT Time Calculation (min)  46 min    Activity Tolerance  Patient tolerated treatment well    Behavior During Therapy  [facility] for tasks assessed/performed       Past Medical History:  Diagnosis Date  . Acute pain of left shoulder 01/11/2017  . Allergy   . Asthma   . Asthma, chronic 08/06/2014  . Chlamydia 06/08/2015  . History of giardia infection   . HIV antibody positive ([facility])   . HIV disease ([facility]) 08/06/2014  . Industrial dermatitis   . Inguinal lymphadenitis 07/12/2017  . Loss of weight     Past Surgical History:  Procedure Laterality Date  . WISDOM TOOTH EXTRACTION      There were no vitals filed for this visit.   Subjective Assessment - 05/21/19 0807    Subjective  Pt arriving to therapy reporting 2 year history of L shoulder pain which was reported as frozen shoulder. Pt was treated by PT and his shoulder improved. Pt now reporting L shoulder pain and neck pain. Pt stated he went for a manipulation at chiropractor yesterday and he is feeling much better.    Limitations  Other (comment);Lifting    Currently in Pain?  Yes    Pain Score  8     Pain Orientation  Left    Pain Descriptors / Indicators  Aching;Sore    Pain Type  Chronic pain    Pain Onset  More than a month ago    Pain Frequency  Constant    Aggravating Factors   sitting prolonged, reaching, showering, driving    Pain Relieving Factors  changing positions    Effect of Pain on Daily Activities  difficutly working and sitting at computer all day.          [facility] PT Assessment - 05/21/19 0001      Assessment   Medical Diagnosis  M25.512, M54.2 L shoulder pain, cervical pain    Hand Dominance  Right    Prior Therapy  yes about 2 years ago for L frozen shoulder      Precautions   Precautions  None      Restrictions   Weight Bearing Restrictions  No      Balance Screen   Has the patient fallen in the past 6 months  No    Is the patient reluctant to leave their home because of a fear of falling?   No      Home Environment   Living Environment  Private residence    Living Arrangements  Other relatives      Prior Function   Level of Independence  Independent      Cognition   Overall Cognitive Status  Within Functional Limits for tasks assessed      Posture/Postural Control   Posture/Postural Control  Postural limitations    Postural Limitations  Rounded Shoulders;Forward head    Posture Comments  elevated shoulders  ROM / Strength   AROM / PROM / Strength  AROM;Strength      AROM   Overall AROM Comments  L shoulder ROM limited by pain    AROM Assessment Site  Shoulder;Cervical    Right/Left Shoulder  Right;Left    Right Shoulder Extension  60 Degrees    Right Shoulder Flexion  168 Degrees    Right Shoulder ABduction  170 Degrees    Right Shoulder Internal Rotation  --   thumb to T6   Right Shoulder External Rotation  80 Degrees    Left Shoulder Extension  58 Degrees   painful   Left Shoulder Flexion  132 Degrees   with pain   Left Shoulder ABduction  112 Degrees   with pain   Left Shoulder Internal Rotation  --   thumb to T6   Left Shoulder External Rotation  70 Degrees    Cervical Flexion  36    Cervical Extension  58    Cervical - Right Side Bend  22    Cervical - Left Side Bend  32    Cervical - Right Rotation  68    Cervical - Left Rotation  55      Strength   Overall Strength  Deficits    Strength Assessment Site  Shoulder    Right/Left Shoulder  Right;Left    Right Shoulder Flexion  5/5     Right Shoulder Extension  5/5    Right Shoulder ABduction  5/5    Right Shoulder Internal Rotation  5/5    Right Shoulder External Rotation  5/5    Left Shoulder Flexion  4+/5    Left Shoulder Extension  4+/5    Left Shoulder ABduction  4+/5    Left Shoulder Internal Rotation  4+/5    Left Shoulder External Rotation  4+/5      Palpation   Palpation comment  TTP L upper trap, L cervical paraspinals, L mid trap and thoracic paraspinals   active trigger points noted in upper and mid trap     Special Tests    Special Tests  Rotator Cuff Impingement    Rotator Cuff Impingment tests  Empty Can test;Full Can test      Empty Can test   Findings  Negative    Side  Left      Full Can test   Findings  Negative    Side  Left      Transfers   Five time sit to stand comments   16 seconds no UE support                Objective measurements completed on examination: See above findings.              PT Education - 05/21/19 0910    Education Details  PT POC, Discussed DN for next visit, HEP    Person(s) Educated  Patient    Methods  Explanation;Other (comment)    Comprehension  Verbalized understanding;Returned demonstration          PT Long Term Goals - 05/21/19 0917      PT LONG TERM GOAL #1   Title  Pt will be independent in his HEP and progression.    Time  4    Period  Weeks    Status  New    Target Date  06/21/19      PT LONG TERM GOAL #2   Title  Pt will report 50% less pain  in his L shoulder with ADL's.    Baseline  8/10 pain at rest on 05/21/2019    Time  4    Period  Weeks    Status  New    Target Date  06/21/19      PT LONG TERM GOAL #3   Title  pt will improve cervical rotation to >/= 65 bilaterally.    Baseline  see flowsheet    Time  4    Period  Weeks    Status  New      PT LONG TERM GOAL #4   Title  Pt will understand proper posture and body mechanics and report </= 3/10 pain while working.    Baseline  8/10 to 10/10 at times     Time  4    Period  Weeks    Status  New             Plan - 05/21/19 0831    Clinical Impression Statement  Pt presenting today with dx of L shoulder pain and cervicalgia. Pt reporting 2 year history of L shoulder problems which began with a frozen shoulder. Pt received PT and his shoulder improved 2 years ago but has worsened over tha last few months. Pt reporting 8/10 pain extending from his L cervical paraspinals to lumbar spine. Pt reporting pain with prolonged sitting, reaching, driving and working. Pt tender to palpation over upper trap, mid trap and cervical paraspinals on the left with active trigger points noted. Pt with limitation in L shoulder ROM due to pain and cervical rotation. Skilled PT needed to address pt's impairments with the below interventions.    Examination-Activity Limitations  Stand;Dressing;Carry;Lift;Sit;Other    Examination-Participation Restrictions  Driving;Other    Stability/Clinical Decision Making  Stable/Uncomplicated    Clinical Decision Making  Low    Rehab Potential  Good    PT Frequency  2x / week    PT Duration  4 weeks    PT Treatment/Interventions  ADLs/Self Care Home Management;Cryotherapy;Ultrasound;Traction;Moist Heat;Electrical Stimulation;Iontophoresis 4mg /ml Dexamethasone;Functional mobility training;Gait training;Neuromuscular re-education;Therapeutic exercise;Therapeutic activities;Patient/family education;Manual techniques;Dry needling;Passive range of motion;Taping    PT Next Visit Plan  Dry Needling, taping as needed, STM, postural strengthening, shoulder ROM and stability exercises    PT Home Exercise Plan  Access Code: [email or web address]    Consulted and Agree with Plan of Care  Patient       Patient will benefit from skilled therapeutic intervention in order to improve the following deficits and impairments:  Pain, Postural dysfunction, Decreased strength, Impaired UE functional use, Decreased range of motion  Visit  Diagnosis: Cervicalgia  Chronic left shoulder pain  Stiffness of left shoulder, not elsewhere classified  Pain in thoracic spine     Problem List Patient Active Problem List   Diagnosis Date Noted  . Inguinal lymphadenitis 07/12/2017  . Chronic left shoulder pain 01/13/2017  . Low HDL (under 40) 07/07/2015  . Allergic contact dermatitis 11/09/2014  . Chronic constipation 11/09/2014  . History of gonorrhea 11/09/2014  . History of giardia infection 11/09/2014  . HIV disease ([facility]) 08/06/2014  . Asthma, mild intermittent 08/06/2014  . Seasonal allergic rhinitis 08/31/2006    10/31/2006, PT 05/21/2019, 9:23 AM  [facility] Physical Therapy [address] [place], [place], [place] Phone: [phone]   Fax:  [phone]  Name: David Mendez MRN: Delsa Grana Date of Birth: 02/03/1996

## 2019-05-21 NOTE — Patient Instructions (Signed)
Access Code: [email or web address] URL: [email or web address] Date: 05/21/2019 Prepared by: Narda Amber  Exercises Seated Assisted Cervical Rotation with Towel - 5 reps - 10 seconds hold - 3x daily - 7x weekly Cervical Retraction at Wall - 10 reps - 10 seconds hold - 3x daily - 7x weekly Seated Cervical Sidebending Stretch - 5 reps - 10 seconds hold - 3x daily - 7x weekly Doorway Rhomboid Stretch - 5 reps - 20 seconds hold - 3x daily - 7x weekly Corner Pec Minor Stretch - 5 reps - 20 seconds hold - 3x daily - 7x weekly

## 2019-05-23 ENCOUNTER — Other Ambulatory Visit: Payer: Self-pay

## 2019-05-23 ENCOUNTER — Ambulatory Visit: Payer: Self-pay | Admitting: Infectious Disease

## 2019-05-23 ENCOUNTER — Ambulatory Visit: Payer: Self-pay

## 2019-05-31 ENCOUNTER — Ambulatory Visit (INDEPENDENT_AMBULATORY_CARE_PROVIDER_SITE_OTHER): Payer: Self-pay | Admitting: Rehabilitative and Restorative Service Providers"

## 2019-05-31 ENCOUNTER — Telehealth: Payer: Self-pay | Admitting: Rehabilitative and Restorative Service Providers"

## 2019-05-31 ENCOUNTER — Other Ambulatory Visit: Payer: Self-pay

## 2019-05-31 ENCOUNTER — Encounter: Payer: Self-pay | Admitting: Rehabilitative and Restorative Service Providers"

## 2019-05-31 DIAGNOSIS — M25612 Stiffness of left shoulder, not elsewhere classified: Secondary | ICD-10-CM

## 2019-05-31 DIAGNOSIS — M542 Cervicalgia: Secondary | ICD-10-CM

## 2019-05-31 DIAGNOSIS — G8929 Other chronic pain: Secondary | ICD-10-CM

## 2019-05-31 DIAGNOSIS — M546 Pain in thoracic spine: Secondary | ICD-10-CM

## 2019-05-31 DIAGNOSIS — M25512 Pain in left shoulder: Secondary | ICD-10-CM

## 2019-05-31 NOTE — Telephone Encounter (Signed)
Called after Pt. Did not show for first 15 mins of appointment time.  He indicated he was running late and could arrive in approx. 10 mins.   Will see him when he arrives   Chyrel Masson, PT, DPT, OCS, ATC 05/31/19  10:34 AM

## 2019-05-31 NOTE — Therapy (Signed)
[facility] Physical Therapy [address] [place], [place], [address] Phone: [phone]   Fax:  [phone]  Physical Therapy Treatment  Patient Details  Name: David Mendez MRN: [id number] Date of Birth: March 27, 1995 No data recorded  Encounter Date: 05/31/2019  PT End of Session - 05/31/19 1120    Visit Number  2    Number of Visits  8    Date for PT Re-Evaluation  06/21/19    PT Start Time  1101    PT Stop Time  1143    PT Time Calculation (min)  42 min    Activity Tolerance  Patient tolerated treatment well    Behavior During Therapy  [facility] for tasks assessed/performed       Past Medical History:  Diagnosis Date  . Acute pain of left shoulder 01/11/2017  . Allergy   . Asthma   . Asthma, chronic 08/06/2014  . Chlamydia 06/08/2015  . History of giardia infection   . HIV antibody positive ([facility])   . HIV disease ([facility]) 08/06/2014  . Industrial dermatitis   . Inguinal lymphadenitis 07/12/2017  . Loss of weight     Past Surgical History:  Procedure Laterality Date  . WISDOM TOOTH EXTRACTION      There were no vitals filed for this visit.  Subjective Assessment - 05/31/19 1101    Subjective  Pt. stated feeling some improvement c HEP at this time but still hurting.    Limitations  Other (comment);Lifting    Currently in Pain?  Yes    Pain Score  7     Pain Location  Back    Pain Orientation  Left    Pain Descriptors / Indicators  Aching;Sore    Pain Type  Chronic pain    Pain Onset  More than a month ago                       [facility] Adult PT Treatment/Exercise - 05/31/19 0001      Exercises   Exercises  Neck;Shoulder      Neck Exercises: Seated   Other Seated Exercise  upper trap stretch 15 sec x 5 Lt      Shoulder Exercises: Seated   Retraction  AROM;Both;20 reps   2-3 second hold   Other Seated Exercises  barrel hug x 10 2 second hold      Shoulder Exercises: Standing   Extension  AROM;Strengthening;Both;20 reps    Theraband Level (Shoulder Extension)  Level 3 (Green)    Row  20 reps;Strengthening;Both    Theraband Level (Shoulder Row)  Level 3 (Green)      Shoulder Exercises: ROM/Strengthening   UBE (Upper Arm Bike)  Lvl 1 3 mins each way fwd/rev      Modalities   Modalities  Electrical Stimulation      Electrical Stimulation   Electrical Stimulation Location  Lt posterior shoulder/upper trap    Electrical Stimulation Action  IFC    Electrical Stimulation Parameters  to tolerance c exercise performed (10 mins)    Electrical Stimulation Goals  Pain      Manual Therapy   Manual Therapy  Soft tissue mobilization    Soft tissue mobilization  active compression to Lt upper trap, infraspinatus, STM prior to DN       Trigger Point Dry Needling - 05/31/19 0001    Consent Given?  Yes    Education Handout Provided  Yes    Muscles Treated Head and Neck  Upper trapezius    Muscles Treated Upper Quadrant  Infraspinatus    Dry Needling Comments  Lt shoulder    Upper Trapezius Response  Twitch reponse elicited    Infraspinatus Response  Twitch response elicited           PT Education - 05/31/19 1118    Education Details  DN review, HEP cues    Person(s) Educated  Patient    Methods  Explanation;Other (comment)    Comprehension  Verbalized understanding;Returned demonstration          PT Long Term Goals - 05/21/19 0917      PT LONG TERM GOAL #1   Title  Pt will be independent in his HEP and progression.    Time  4    Period  Weeks    Status  New    Target Date  06/21/19      PT LONG TERM GOAL #2   Title  Pt will report 50% less pain in his L shoulder with ADL's.    Baseline  8/10 pain at rest on 05/21/2019    Time  4    Period  Weeks    Status  New    Target Date  06/21/19      PT LONG TERM GOAL #3   Title  pt will improve cervical rotation to >/= 65 bilaterally.    Baseline  see flowsheet    Time  4    Period  Weeks    Status  New      PT LONG TERM GOAL #4   Title  Pt  will understand proper posture and body mechanics and report </= 3/10 pain while working.    Baseline  8/10 to 10/10 at times    Time  4    Period  Weeks    Status  New            Plan - 05/31/19 1118    Clinical Impression Statement  DN reponse and palpation to Lt upper trap and infraspinatus produced concordant symptoms in affected areas c improvement in symptoms c movmeent post intervention.    Examination-Activity Limitations  Stand;Dressing;Carry;Lift;Sit;Other    Examination-Participation Restrictions  Driving;Other    Stability/Clinical Decision Making  Stable/Uncomplicated    Rehab Potential  Good    PT Frequency  2x / week    PT Duration  4 weeks    PT Treatment/Interventions  ADLs/Self Care Home Management;Cryotherapy;Ultrasound;Traction;Moist Heat;Electrical Stimulation;Iontophoresis 4mg /ml Dexamethasone;Functional mobility training;Gait training;Neuromuscular re-education;Therapeutic exercise;Therapeutic activities;Patient/family education;Manual techniques;Dry needling;Passive range of motion;Taping    PT Next Visit Plan  DN prn, continue to work on postural improvements/activation.    PT Home Exercise Plan  Access Code: [email or web address]    Consulted and Agree with Plan of Care  Patient       Patient will benefit from skilled therapeutic intervention in order to improve the following deficits and impairments:  Pain, Postural dysfunction, Decreased strength, Impaired UE functional use, Decreased range of motion  Visit Diagnosis: Cervicalgia  Chronic left shoulder pain  Stiffness of left shoulder, not elsewhere classified  Pain in thoracic spine     Problem List Patient Active Problem List   Diagnosis Date Noted  . Inguinal lymphadenitis 07/12/2017  . Chronic left shoulder pain 01/13/2017  . Low HDL (under 40) 07/07/2015  . Allergic contact dermatitis 11/09/2014  . Chronic constipation 11/09/2014  . History of gonorrhea 11/09/2014  . History of giardia  infection 11/09/2014  . HIV disease ([place]) 08/06/2014  .  Asthma, mild intermittent 08/06/2014  . Seasonal allergic rhinitis 08/31/2006    Chyrel Masson, PT, DPT, OCS, ATC 05/31/19  11:35 AM    [facility] Physical Therapy [address] [place], [place], [address] Phone: [phone]   Fax:  [phone]  Name: ELICEO GLADU MRN: [id number] Date of Birth: 16-Mar-1996

## 2019-06-03 ENCOUNTER — Other Ambulatory Visit: Payer: Self-pay

## 2019-06-03 ENCOUNTER — Ambulatory Visit (INDEPENDENT_AMBULATORY_CARE_PROVIDER_SITE_OTHER): Payer: Self-pay | Admitting: Pharmacist

## 2019-06-03 DIAGNOSIS — Z23 Encounter for immunization: Secondary | ICD-10-CM

## 2019-06-03 DIAGNOSIS — Z113 Encounter for screening for infections with a predominantly sexual mode of transmission: Secondary | ICD-10-CM

## 2019-06-03 DIAGNOSIS — B20 Human immunodeficiency virus [HIV] disease: Secondary | ICD-10-CM

## 2019-06-03 NOTE — Progress Notes (Signed)
HPI: David Mendez is a 24 y.o. male who presents to the [facility] pharmacy clinic for HIV follow-up.  Patient Active Problem List   Diagnosis Date Noted  . Inguinal lymphadenitis 07/12/2017  . Chronic left shoulder pain 01/13/2017  . Low HDL (under 40) 07/07/2015  . Allergic contact dermatitis 11/09/2014  . Chronic constipation 11/09/2014  . History of gonorrhea 11/09/2014  . History of giardia infection 11/09/2014  . HIV disease (HCC) 08/06/2014  . Asthma, mild intermittent 08/06/2014  . Seasonal allergic rhinitis 08/31/2006    Patient's Medications  New Prescriptions   No medications on file  Previous Medications   ALBUTEROL (VENTOLIN HFA) 108 (90 BASE) MCG/ACT INHALER    INHALE 2 PUFFS INTO THE LUNGS EVERY 4 (FOUR) HOURS AS NEEDED FOR WHEEZING OR SHORTNESS OF BREATH.   CYCLOBENZAPRINE (FLEXERIL) 5 MG TABLET    Take 1-2 tablets (5-10 mg total) by mouth 3 (three) times daily as needed for muscle spasms.   DICLOFENAC (VOLTAREN) 75 MG EC TABLET    Take 1 tablet (75 mg total) by mouth 2 (two) times daily.   DOLUTEGRAVIR-LAMIVUDINE (DOVATO) 50-300 MG TABS    Take 1 tablet by mouth daily for 30 days.   DOXYCYCLINE (VIBRA-TABS) 100 MG TABLET    Take 1 tablet (100 mg total) by mouth 2 (two) times daily.   FLUTICASONE FUROATE-VILANTEROL (BREO ELLIPTA) 100-25 MCG/INH AEPB    Inhale 1 puff into the lungs daily.   TIZANIDINE (ZANAFLEX) 4 MG TABLET    Take 1 tablet (4 mg total) by mouth every 6 (six) hours as needed for muscle spasms.  Modified Medications   No medications on file  Discontinued Medications   No medications on file    Allergies: No Known Allergies  Past Medical History: Past Medical History:  Diagnosis Date  . Acute pain of left shoulder 01/11/2017  . Allergy   . Asthma   . Asthma, chronic 08/06/2014  . Chlamydia 06/08/2015  . History of giardia infection   . HIV antibody positive (HCC)   . HIV disease (HCC) 08/06/2014  . Industrial dermatitis   . Inguinal  lymphadenitis 07/12/2017  . Loss of weight     Social History: Social History   Socioeconomic History  . Marital status: Single    Spouse name: Not on file  . Number of children: 0  . Years of education: Not on file  . Highest education level: 12th grade  Occupational History  . Not on file  Tobacco Use  . Smoking status: Never Smoker  . Smokeless tobacco: Never Used  Substance and Sexual Activity  . Alcohol use: Yes    Alcohol/week: 0.0 standard drinks    Comment: very seldom, but binges sometimes  . Drug use: No  . Sexual activity: Yes    Partners: Male    Birth control/protection: Condom  Other Topics Concern  . 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 Difficulty of Paying Living Expenses: Very hard  Food Insecurity: No Food Insecurity  . Worried About Programme researcher, broadcasting/film/video in the Last Year: Never true  . Ran Out of Food in the Last Year: Never true  Transportation Needs: No Transportation Needs  . Lack of Transportation (  Medical): No  . Lack of Transportation (Non-Medical): No  Physical Activity: Inactive  . Days of Exercise per Week: 0 days  . Minutes of Exercise per Session: 0 min  Stress: Stress Concern Present  . Feeling of Stress : Very much  Social Connections:   . Frequency of Communication with Friends and Family:   . Frequency of Social Gatherings with Friends and Family:   . Attends Religious Services:   . Active Member of Clubs or Organizations:   . Attends Archivist Meetings:   Marland Kitchen Marital Status:     Labs: Lab Results  Component Value Date   HIV1RNAQUANT <20 NOT DETECTED 04/26/2019   HIV1RNAQUANT 425 (H) 08/14/2018   HIV1RNAQUANT <20 DETECTED (A) 06/28/2017   CD4TABS 1,533 04/26/2019   CD4TABS 1,377  08/14/2018   CD4TABS 1,543 06/28/2017    RPR and STI Lab Results  Component Value Date   LABRPR NON-REACTIVE 04/26/2019   LABRPR NON-REACTIVE 08/14/2018   LABRPR NON-REACTIVE 06/28/2017   LABRPR NON REAC 09/20/2016   LABRPR NON REAC 05/25/2015   Hepatitis B Lab Results  Component Value Date   HEPBSAB POS (A) 07/22/2014   HEPBSAG NEGATIVE 07/22/2014   HEPBCAB NON REACTIVE 07/22/2014   Hepatitis C No results found for: HEPCAB, HCVRNAPCRQN Hepatitis A Lab Results  Component Value Date   HAV REACTIVE (A) 07/22/2014   Lipids: Lab Results  Component Value Date   CHOL 184 08/14/2018   TRIG 269 (H) 08/14/2018   HDL 30 (L) 08/14/2018   CHOLHDL 6.1 (H) 08/14/2018   VLDL 27 05/25/2015   LDLCALC 116 (H) 08/14/2018    Current HIV Regimen: Dovato  Assessment: JM is here today for his well controlled HIV on Dovato f/u. Overall he is doing well and presents with no questions or concerns. Pt is doing well on Dovato. He reports having missed 1-2 doses.   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  Plan: 1) flu shot: right arm 2) Labs: oral/rectal STI swabs 3) F/U in July 2021.  Ned Clines Student Pharmacist, Class of [employer] for Infectious Disease 06/03/2019, 9:37 AM

## 2019-06-04 LAB — CYTOLOGY, (ORAL, ANAL, URETHRAL) ANCILLARY ONLY
Chlamydia: NEGATIVE
Chlamydia: POSITIVE — AB
Comment: NEGATIVE
Comment: NEGATIVE
Comment: NORMAL
Comment: NORMAL
Neisseria Gonorrhea: NEGATIVE
Neisseria Gonorrhea: NEGATIVE

## 2019-06-04 MED ORDER — DOVATO 50-300 MG PO TABS: 1.0000 | ORAL_TABLET | Freq: Every day | ORAL | 6 refills | Status: DC

## 2019-06-04 NOTE — Addendum Note (Signed)
Addended by: Robinette Haines on: 06/04/2019 09:12 AM   Modules accepted: Orders

## 2019-06-05 ENCOUNTER — Telehealth: Payer: Self-pay | Admitting: Pharmacist

## 2019-06-05 DIAGNOSIS — A749 Chlamydial infection, unspecified: Secondary | ICD-10-CM

## 2019-06-05 MED ORDER — DOXYCYCLINE HYCLATE 100 MG PO TABS: 100.0000 mg | ORAL_TABLET | Freq: Two times a day (BID) | ORAL | 0 refills | Status: DC

## 2019-06-05 NOTE — Telephone Encounter (Signed)
Thanks Cassie 

## 2019-06-05 NOTE — Telephone Encounter (Signed)
Patient's rectal swab came back positive for chlamydia. Called to discuss. Will send in doxycycline 100 mg PO BID x 7 days to [facility] for him to pick up.  Advised him to abstain from any sexual activity for 10 days and to get his partners tested/treated. He verbalized understanding.

## 2019-06-06 LAB — URINE CYTOLOGY ANCILLARY ONLY
Comment: NEGATIVE
Comment: NORMAL
Neisseria Gonorrhea: NEGATIVE

## 2019-06-07 ENCOUNTER — Other Ambulatory Visit: Payer: Self-pay

## 2019-06-07 ENCOUNTER — Encounter: Payer: Self-pay | Admitting: Rehabilitative and Restorative Service Providers"

## 2019-06-07 ENCOUNTER — Ambulatory Visit (INDEPENDENT_AMBULATORY_CARE_PROVIDER_SITE_OTHER): Payer: Self-pay | Admitting: Rehabilitative and Restorative Service Providers"

## 2019-06-07 DIAGNOSIS — M546 Pain in thoracic spine: Secondary | ICD-10-CM

## 2019-06-07 DIAGNOSIS — M542 Cervicalgia: Secondary | ICD-10-CM

## 2019-06-07 DIAGNOSIS — M25612 Stiffness of left shoulder, not elsewhere classified: Secondary | ICD-10-CM

## 2019-06-07 DIAGNOSIS — G8929 Other chronic pain: Secondary | ICD-10-CM

## 2019-06-07 DIAGNOSIS — M25512 Pain in left shoulder: Secondary | ICD-10-CM

## 2019-06-07 NOTE — Therapy (Addendum)
[facility] [address] [place], [place], [address] Phone: [phone]   Fax:  [phone]  Physical Therapy Treatment/Discharge  Patient Details  Name: David Mendez MRN: [id number] Date of Birth: 06/20/1995 No data recorded  Encounter Date: 06/07/2019  PT End of Session - 06/07/19 1046    Visit Number  3    Number of Visits  8    Date for PT Re-Evaluation  06/21/19    PT Start Time  9528    PT Stop Time  1058    PT Time Calculation (min)  43 min    Activity Tolerance  Patient tolerated treatment well    Behavior During Therapy  [facility] for tasks assessed/performed       Past Medical History:  Diagnosis Date  . Acute pain of left shoulder 01/11/2017  . Allergy   . Asthma   . Asthma, chronic 08/06/2014  . Chlamydia 06/08/2015  . History of giardia infection   . HIV antibody positive ([place])   . HIV disease ([place]) 08/06/2014  . Industrial dermatitis   . Inguinal lymphadenitis 07/12/2017  . Loss of weight     Past Surgical History:  Procedure Laterality Date  . WISDOM TOOTH EXTRACTION      There were no vitals filed for this visit.  Subjective Assessment - 06/07/19 1015    Subjective  Pt. stated feeling sore for a day but then felt pain similar and still hurting.  Reported 6-7/10 pain upon arrival across upper and mid back.    Limitations  Other (comment);Lifting    Currently in Pain?  Yes    Pain Score  7     Pain Location  Back    Pain Orientation  Left    Pain Descriptors / Indicators  Aching;Sore    Pain Onset  More than a month ago                       [facility] Adult PT Treatment/Exercise - 06/07/19 0001      Exercises   Exercises  Lumbar      Neck Exercises: Theraband   Rows  20 reps;Green      Neck Exercises: Supine   Neck Retraction  10 reps;5 secs    Shoulder ABduction  Both;20 reps   green tband     Lumbar Exercises: Quadruped   Opposite Arm/Leg Raise  Left arm/Right leg;Right arm/Left leg;15  reps;3 seconds    Other Quadruped Lumbar Exercises  qruped thoracic ext/flexion c rotation x 15 bilaterally      Electrical Stimulation   Electrical Stimulation Location  upper and mid thoracic paraspinals    Electrical Stimulation Action  IFC    Electrical Stimulation Parameters  to tolerance c lumbar manual intervention performed (approx. 6-8 mins)    Electrical Stimulation Goals  Pain      Manual Therapy   Soft tissue mobilization  Regional PA mob g4 to mid and upper thoracic.  cPA L2 L1-L5       Trigger Point Dry Needling - 06/07/19 0001    Consent Given?  Yes    Education Handout Provided  Previously provided    Other Dry Needling  Twitch response noted from multifidi T4-T7 Lt, Lt rhomboid near scapular attachment           PT Education - 06/07/19 1041    Education Details  Cues for new intervention.    Person(s) Educated  Patient    Methods  Explanation;Demonstration;Verbal cues  Comprehension  Verbalized understanding;Returned demonstration          PT Long Term Goals - 05/21/19 0917      PT LONG TERM GOAL #1   Title  Pt will be independent in his HEP and progression.    Time  4    Period  Weeks    Status  New    Target Date  06/21/19      PT LONG TERM GOAL #2   Title  Pt will report 50% less pain in his L shoulder with ADL's.    Baseline  8/10 pain at rest on 05/21/2019    Time  4    Period  Weeks    Status  New    Target Date  06/21/19      PT LONG TERM GOAL #3   Title  pt will improve cervical rotation to >/= 65 bilaterally.    Baseline  see flowsheet    Time  4    Period  Weeks    Status  New      PT LONG TERM GOAL #4   Title  Pt will understand proper posture and body mechanics and report </= 3/10 pain while working.    Baseline  8/10 to 10/10 at times    Time  4    Period  Weeks    Status  New            Plan - 06/07/19 1041    Clinical Impression Statement  Presentation today continued to show muscular restriction throughout  cervical. thoracic and lumbar as well as core stability movement coordination deficits.  Pt. to benefit from adjustment of intervention to improve myofascial mobility in affected areas as well as improving movement coordination control.    Examination-Activity Limitations  Stand;Dressing;Carry;Lift;Sit;Other    Examination-Participation Restrictions  Driving;Other    Stability/Clinical Decision Making  Stable/Uncomplicated    Rehab Potential  Good    PT Frequency  2x / week    PT Duration  4 weeks    PT Treatment/Interventions  ADLs/Self Care Home Management;Cryotherapy;Ultrasound;Traction;Moist Heat;Electrical Stimulation;Iontophoresis 36m/ml Dexamethasone;Functional mobility training;Gait training;Neuromuscular re-education;Therapeutic exercise;Therapeutic activities;Patient/family education;Manual techniques;Dry needling;Passive range of motion;Taping    PT Next Visit Plan  Core stability progression and myofascial mobility improvements.    PT Home Exercise Plan  Access Code: [email or web address]    Consulted and Agree with Plan of Care  Patient       Patient will benefit from skilled therapeutic intervention in order to improve the following deficits and impairments:  Pain, Postural dysfunction, Decreased strength, Impaired UE functional use, Decreased range of motion  Visit Diagnosis: Cervicalgia  Chronic left shoulder pain  Stiffness of left shoulder, not elsewhere classified  Pain in thoracic spine     Problem List Patient Active Problem List   Diagnosis Date Noted  . Inguinal lymphadenitis 07/12/2017  . Chronic left shoulder pain 01/13/2017  . Low HDL (under 40) 07/07/2015  . Allergic contact dermatitis 11/09/2014  . Chronic constipation 11/09/2014  . History of gonorrhea 11/09/2014  . History of giardia infection 11/09/2014  . HIV disease ([place] 08/06/2014  . Asthma, mild intermittent 08/06/2014  . Seasonal allergic rhinitis 08/31/2006    MScot Jun PT, DPT, OCS,  ATC 06/07/19  11:01 AM   PHYSICAL THERAPY DISCHARGE SUMMARY  Visits from Start of Care: 3  Current functional level related to goals / functional outcomes: See note   Remaining deficits: See note   Education / Equipment: HEP Plan: Patient agrees to  discharge.  Patient goals were partially met. Patient is being discharged due to not returning since the last visit.  ?????     Scot Jun, PT, DPT, OCS, ATC 08/15/19  9:18 AM   [facility] [address] [place], [place], [address] Phone: [phone]   Fax:  [phone]  Name: David Mendez MRN: [id number] Date of Birth: [birth year]

## 2019-06-07 NOTE — Patient Instructions (Signed)
Access Code: [email or web address] URL: [email or web address] Date: 06/07/2019 Prepared by: Chyrel Masson  Exercises Bird Dog - 1 x daily - 7 x weekly - 1 sets - 15 reps - 3 hold Quadruped Thoracic Rotation - Reach Under - 1 x daily - 7 x weekly - 1 sets - 10 reps - 2 hold Quadruped Thoracic Rotation with Hand on Neck - 1 x daily - 7 x weekly - 1 sets - 10 reps - 2 hold Supine Shoulder Horizontal Abduction with Resistance - 2 x daily - 7 x weekly - 10 reps - 3 sets Supine Cervical Retraction with Towel - 2 x daily - 7 x weekly - 10 reps - 1 sets - 5 hold Standing Shoulder Row with Anchored Resistance - 2 x daily - 7 x weekly - 10 reps - 3 sets

## 2019-06-13 ENCOUNTER — Ambulatory Visit: Payer: Self-pay

## 2019-06-14 ENCOUNTER — Encounter: Payer: Self-pay | Admitting: Rehabilitative and Restorative Service Providers"

## 2019-06-17 ENCOUNTER — Ambulatory Visit: Payer: Self-pay | Attending: Internal Medicine

## 2019-06-20 ENCOUNTER — Ambulatory Visit: Payer: Self-pay

## 2019-06-29 ENCOUNTER — Ambulatory Visit: Payer: Self-pay

## 2019-07-01 ENCOUNTER — Encounter: Payer: Self-pay | Admitting: Rehabilitative and Restorative Service Providers"

## 2019-07-03 ENCOUNTER — Encounter: Payer: Self-pay | Admitting: Rehabilitative and Restorative Service Providers"

## 2019-07-03 ENCOUNTER — Telehealth: Payer: Self-pay | Admitting: Rehabilitative and Restorative Service Providers"

## 2019-07-03 NOTE — Telephone Encounter (Signed)
Called Pt. After 15 mins no show for appointment.  Pt. Indicated he forgot appointment and would call back today to reschedule.  Chyrel Masson, PT, DPT, OCS, ATC 07/03/19  10:32 AM

## 2019-07-22 ENCOUNTER — Encounter: Payer: Self-pay | Admitting: Physical Therapy

## 2019-10-02 ENCOUNTER — Other Ambulatory Visit: Payer: Self-pay | Admitting: Family Medicine

## 2019-10-02 DIAGNOSIS — J4540 Moderate persistent asthma, uncomplicated: Secondary | ICD-10-CM

## 2019-10-02 NOTE — Telephone Encounter (Signed)
Requested Prescriptions  Pending Prescriptions Disp Refills  . albuterol (VENTOLIN HFA) 108 (90 Base) MCG/ACT inhaler [Pharmacy Med Name: ALBUTEROL HFA (PROAIR) INHALER] 18 g 0    Sig: INHALE 2 PUFFS EVERY 4 HOURS AS NEEDED FOR WHEEZING FOR SHORTNESS OF BREATH     Pulmonology:  Beta Agonists Failed - 10/02/2019  2:01 AM      Failed - One inhaler should last at least one month. If the patient is requesting refills earlier, contact the patient to check for uncontrolled symptoms.      Passed - Valid encounter within last 12 months    Recent Outpatient Visits          1 year ago Low HDL (under 40)   [facility] [place], Danna Hefty, MD   2 years ago Mild intermittent asthma with acute exacerbation   [facility] [place], Danna Hefty, MD   3 years ago Moderate persistent asthma without complication   [facility] [place], Danna Hefty, MD   3 years ago Low HDL (under 40)   [facility] Alba Cory, MD   4 years ago Annual physical exam   [facility] Alba Cory, MD      Future Appointments            In 3 weeks Daiva Eves, Lisette Grinder, MD [facility]           This was a courtesy refill with a reminder that patient needs to schedule an appointment for an office visit.

## 2019-10-11 ENCOUNTER — Telehealth: Payer: Self-pay

## 2019-10-11 ENCOUNTER — Other Ambulatory Visit: Payer: Self-pay

## 2019-10-11 DIAGNOSIS — B20 Human immunodeficiency virus [HIV] disease: Secondary | ICD-10-CM

## 2019-10-11 NOTE — Telephone Encounter (Signed)
Patient called office today regarding medication refills. States pharmacy does not have any refills on file for him, and only has a weeks worth of medication on hand. Patient has moved to [place] and has not located a local provider yet. Is getting insurance, but wont be active until August.  Advised patient contact insurance for local ID provider. Once he does find a provider have them fax a signed release of information.  If patient needs additional assistance locating local provider advised to contact health department. Patient verbalized understanding. Will cancel appointments at this time.   David Mendez, [place]

## 2019-10-28 ENCOUNTER — Ambulatory Visit: Payer: Self-pay | Admitting: Infectious Disease

## 2021-08-03 ENCOUNTER — Telehealth: Payer: Self-pay

## 2021-08-03 ENCOUNTER — Ambulatory Visit: Payer: Self-pay | Admitting: Pharmacist

## 2021-08-03 NOTE — Telephone Encounter (Signed)
Patient called with issues getting Dovato from his pharmacy, left voicemail asking if he could come in to pick up samples.  ? ?Returned patients call, advised him that he will need an appointment before samples can be provided as his last appointment here was 05/2019. Patient accepts appointment with pharmacist for this afternoon.  ? ? ?Sandie Ano, RN ? ?

## 2021-08-03 NOTE — Telephone Encounter (Signed)
Thank you :)

## 2021-09-24 ENCOUNTER — Telehealth: Payer: Self-pay

## 2021-09-24 NOTE — Telephone Encounter (Signed)
Patient called requesting Dovato samples. Patient was seeing another infectious disease provider and transferring care to our office. Patient last seen in 2021. I advised patient his previous ID provider should provide refills. Patient no showed last appointment in May and needs to reschedule.    David Mendez, CMA

## 2021-11-01 ENCOUNTER — Other Ambulatory Visit: Payer: Self-pay

## 2021-11-01 ENCOUNTER — Ambulatory Visit: Payer: Self-pay

## 2021-11-01 ENCOUNTER — Other Ambulatory Visit: Payer: 59

## 2021-11-01 DIAGNOSIS — B20 Human immunodeficiency virus [HIV] disease: Secondary | ICD-10-CM

## 2021-11-01 DIAGNOSIS — Z113 Encounter for screening for infections with a predominantly sexual mode of transmission: Secondary | ICD-10-CM

## 2021-11-01 DIAGNOSIS — Z79899 Other long term (current) drug therapy: Secondary | ICD-10-CM

## 2021-11-04 ENCOUNTER — Ambulatory Visit: Payer: Self-pay

## 2021-11-04 ENCOUNTER — Other Ambulatory Visit: Payer: 59

## 2021-11-16 ENCOUNTER — Other Ambulatory Visit: Payer: Self-pay

## 2021-11-16 ENCOUNTER — Other Ambulatory Visit (HOSPITAL_COMMUNITY)
Admission: RE | Admit: 2021-11-16 | Discharge: 2021-11-16 | Disposition: A | Discharge status: Home / Self Care | Payer: 59 | Source: Ambulatory Visit | Attending: Infectious Disease | Admitting: Infectious Disease

## 2021-11-16 ENCOUNTER — Ambulatory Visit: Payer: 59

## 2021-11-16 ENCOUNTER — Other Ambulatory Visit: Payer: 59

## 2021-11-16 DIAGNOSIS — B20 Human immunodeficiency virus [HIV] disease: Secondary | ICD-10-CM | POA: Diagnosis present

## 2021-11-16 DIAGNOSIS — Z113 Encounter for screening for infections with a predominantly sexual mode of transmission: Secondary | ICD-10-CM

## 2021-11-16 DIAGNOSIS — Z79899 Other long term (current) drug therapy: Secondary | ICD-10-CM

## 2021-11-17 ENCOUNTER — Encounter: Payer: Self-pay | Admitting: Infectious Disease

## 2021-11-17 LAB — T-HELPER CELL (CD4) - (RCID CLINIC ONLY)
CD4 % Helper T Cell: 43 % (ref 33–65)
CD4 T Cell Abs: 1338 /uL (ref 400–1790)

## 2021-11-17 LAB — URINE CYTOLOGY ANCILLARY ONLY
Chlamydia: NEGATIVE
Comment: NEGATIVE
Comment: NORMAL
Neisseria Gonorrhea: NEGATIVE

## 2021-11-18 LAB — CBC WITH DIFFERENTIAL/PLATELET
Absolute Monocytes: 359 cells/uL (ref 200–950)
Basophils Absolute: 48 cells/uL (ref 0–200)
Basophils Relative: 0.7 %
Eosinophils Absolute: 311 cells/uL (ref 15–500)
Eosinophils Relative: 4.5 %
HCT: 44.2 % (ref 38.5–50.0)
Hemoglobin: 15.6 g/dL (ref 13.2–17.1)
Lymphs Abs: 3629 cells/uL (ref 850–3900)
MCH: 32.6 pg (ref 27.0–33.0)
MCHC: 35.3 g/dL (ref 32.0–36.0)
MCV: 92.5 fL (ref 80.0–100.0)
MPV: 10.5 fL (ref 7.5–12.5)
Monocytes Relative: 5.2 %
Neutro Abs: 2553 cells/uL (ref 1500–7800)
Neutrophils Relative %: 37 %
Platelets: 244 10*3/uL (ref 140–400)
RBC: 4.78 10*6/uL (ref 4.20–5.80)
RDW: 12.6 % (ref 11.0–15.0)
Total Lymphocyte: 52.6 %
WBC: 6.9 10*3/uL (ref 3.8–10.8)

## 2021-11-18 LAB — COMPLETE METABOLIC PANEL WITH GFR
AG Ratio: 2.1 (calc) (ref 1.0–2.5)
ALT: 40 U/L (ref 9–46)
AST: 18 U/L (ref 10–40)
Albumin: 4.8 g/dL (ref 3.6–5.1)
Alkaline phosphatase (APISO): 80 U/L (ref 36–130)
BUN: 17 mg/dL (ref 7–25)
CO2: 27 mmol/L (ref 20–32)
Calcium: 9.9 mg/dL (ref 8.6–10.3)
Chloride: 108 mmol/L (ref 98–110)
Creat: 0.97 mg/dL (ref 0.60–1.24)
Globulin: 2.3 g/dL (calc) (ref 1.9–3.7)
Glucose, Bld: 77 mg/dL (ref 65–99)
Potassium: 4 mmol/L (ref 3.5–5.3)
Sodium: 143 mmol/L (ref 135–146)
Total Bilirubin: 0.7 mg/dL (ref 0.2–1.2)
Total Protein: 7.1 g/dL (ref 6.1–8.1)
eGFR: 110 mL/min/{1.73_m2} (ref >=60)

## 2021-11-18 LAB — LIPID PANEL
Cholesterol: 213 mg/dL — ABNORMAL HIGH (ref <200)
HDL: 30 mg/dL — ABNORMAL LOW (ref >=40)
LDL Cholesterol (Calc): 140 mg/dL (calc) — ABNORMAL HIGH
Non-HDL Cholesterol (Calc): 183 mg/dL (calc) — ABNORMAL HIGH (ref <130)
Total CHOL/HDL Ratio: 7.1 (calc) — ABNORMAL HIGH (ref <5.0)
Triglycerides: 282 mg/dL — ABNORMAL HIGH (ref <150)

## 2021-11-18 LAB — HIV-1 RNA QUANT-NO REFLEX-BLD
HIV 1 RNA Quant: <20 Copies/mL — ABNORMAL HIGH
HIV-1 RNA Quant, Log: <1.3 Log cps/mL — ABNORMAL HIGH

## 2021-11-18 LAB — RPR: RPR Ser Ql: NONREACTIVE

## 2021-12-06 ENCOUNTER — Ambulatory Visit: Payer: 59 | Admitting: Infectious Disease

## 2022-01-21 ENCOUNTER — Telehealth: Payer: Self-pay

## 2022-01-21 DIAGNOSIS — J4540 Moderate persistent asthma, uncomplicated: Secondary | ICD-10-CM

## 2022-01-21 NOTE — Telephone Encounter (Signed)
Patient called, will be returning to care with Dr. Tommy Medal. He is requesting refills of albuterol. Was last prescribed by his provider in [place]. Will route to provider.   Beryle Flock, RN

## 2022-01-24 MED ORDER — ALBUTEROL SULFATE HFA 108 (90 BASE) MCG/ACT IN AERS: INHALATION_SPRAY | RESPIRATORY_TRACT | 0 refills | Status: DC

## 2022-01-24 NOTE — Addendum Note (Signed)
Addended by: Lucie Leather D on: 01/24/2022 08:19 AM   Modules accepted: Orders

## 2022-01-26 ENCOUNTER — Other Ambulatory Visit (HOSPITAL_COMMUNITY): Payer: Self-pay

## 2022-01-26 ENCOUNTER — Ambulatory Visit (INDEPENDENT_AMBULATORY_CARE_PROVIDER_SITE_OTHER): Payer: 59

## 2022-01-26 ENCOUNTER — Ambulatory Visit (INDEPENDENT_AMBULATORY_CARE_PROVIDER_SITE_OTHER): Payer: 59 | Admitting: Infectious Disease

## 2022-01-26 ENCOUNTER — Other Ambulatory Visit: Payer: Self-pay

## 2022-01-26 VITALS — BP 122/87 | HR 101 | Temp 98.2°F | Wt 196.0 lb

## 2022-01-26 DIAGNOSIS — M546 Pain in thoracic spine: Secondary | ICD-10-CM

## 2022-01-26 DIAGNOSIS — Z23 Encounter for immunization: Secondary | ICD-10-CM | POA: Diagnosis not present

## 2022-01-26 DIAGNOSIS — Z2809 Immunization not carried out because of other contraindication: Secondary | ICD-10-CM | POA: Diagnosis not present

## 2022-01-26 DIAGNOSIS — B20 Human immunodeficiency virus [HIV] disease: Secondary | ICD-10-CM | POA: Diagnosis not present

## 2022-01-26 DIAGNOSIS — J4520 Mild intermittent asthma, uncomplicated: Secondary | ICD-10-CM | POA: Diagnosis not present

## 2022-01-26 DIAGNOSIS — G8929 Other chronic pain: Secondary | ICD-10-CM

## 2022-01-26 DIAGNOSIS — Z7185 Encounter for immunization safety counseling: Secondary | ICD-10-CM

## 2022-01-26 DIAGNOSIS — M542 Cervicalgia: Secondary | ICD-10-CM

## 2022-01-26 DIAGNOSIS — Z113 Encounter for screening for infections with a predominantly sexual mode of transmission: Secondary | ICD-10-CM

## 2022-01-26 MED ORDER — DOVATO 50-300 MG PO TABS
1.0000 | ORAL_TABLET | Freq: Every day | ORAL | 11 refills | Status: DC
  Filled 2022-01-26 – 2022-02-07 (×4): qty 30, 30d supply, fill #0
  Filled 2022-04-11: qty 30, 30d supply, fill #1
  Filled 2022-05-06: qty 30, 30d supply, fill #2
  Filled 2022-06-03: qty 30, 30d supply, fill #3
  Filled 2022-09-13: qty 30, 30d supply, fill #4

## 2022-01-26 NOTE — Progress Notes (Signed)
Subjective:  Chief complaint neck pain and back pain here to reestablish for HIV disease on medications  Patient ID: David Mendez, male    DOB: April 15, 1995, 26 y.o.   MRN: [id number]  HPI  David Mendez is a 26 year old 70 man with HIV who is typically been pretty controlled more recently on Dovato.  He did moved to [facility] and was taken care of by Dr. Ferrel Mendez there in the [place].  He is having no trouble tolerating his Dovato whatsoever.  He does have problems with neck pain which he relates to a prior accident.  In addition the neck pain he has muscle spasms of his upper thoracic area of his spine and at times he has pain that shoots down from his neck down all the way down his legs with numbness.  He has had this symptom chronically and has had previous imaging including MRI he says.  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     Past Medical History:  Diagnosis Date   Acute pain of left shoulder 01/11/2017   Allergy    Asthma    Asthma, chronic 08/06/2014   Chlamydia 06/08/2015   History of giardia infection    HIV antibody positive ([place])    HIV disease ([place]) 08/06/2014   Industrial dermatitis    Inguinal lymphadenitis 07/12/2017   Loss of weight     Past Surgical History:  Procedure Laterality Date   WISDOM TOOTH EXTRACTION      Family History  Problem Relation Age of Onset   Asthma Mother    Allergic rhinitis Mother    Diabetes Mother    Asthma Brother        Younger Brother      Social History   Socioeconomic History   Marital status: Single    Spouse name: Not on file   Number of children: 0   Years of education: Not on file   Highest education level: 12th grade  Occupational History   Not on file  Tobacco Use   Smoking status: Never   Smokeless tobacco: Never  Vaping Use   Vaping Use: Never used  Substance and Sexual Activity   Alcohol use: Yes    Alcohol/week: 0.0  standard drinks of alcohol    Comment: very seldom, but binges sometimes   Drug use: No   Sexual activity: Yes    Partners: Male    Birth control/protection: Condom  Other Topics Concern   Not on file  Social History Narrative   He used to work for [employer],  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, 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, still not working    Investment banker, operational of Radio broadcast assistant Strain: High Risk (09/05/2018)   Overall Financial Resource Strain (CARDIA)    Difficulty of Paying Living Expenses: Very hard  Food Insecurity: No Food Insecurity (09/05/2018)   Hunger Vital Sign    Worried About [place] in the Last Year: Never true    [place] in the Last Year: Never true  Transportation Needs: No Transportation Needs (09/05/2018)   PRAPARE - Hydrologist (Medical): No    Lack of Transportation (Non-Medical): No  Physical Activity: Inactive (09/05/2018)   Exercise Vital Sign    Days of Exercise per Week: 0 days  Minutes of Exercise per Session: 0 min  Stress: Stress Concern Present (09/05/2018)   [place]    Feeling of Stress : Very much  Social Connections: Not on file    No Known Allergies   Current Outpatient Medications:    albuterol (VENTOLIN HFA) 108 (90 Base) MCG/ACT inhaler, INHALE 2 PUFFS EVERY 4 HOURS AS NEEDED FOR WHEEZING FOR SHORTNESS OF BREATH, Disp: 18 g, Rfl: 0   cyclobenzaprine (FLEXERIL) 5 MG tablet, Take 1-2 tablets (5-10 mg total) by mouth 3 (three) times daily as needed for muscle spasms., Disp: 30 tablet, Rfl: 3   diclofenac (VOLTAREN) 75 MG EC tablet, Take 1 tablet (75 mg total) by mouth 2 (two) times daily. (Patient not taking: Reported on 12/21/2018), Disp: 30 tablet, Rfl: 2   Dolutegravir-lamiVUDine (DOVATO) 50-300 MG TABS, Take 1 tablet by mouth daily., Disp: 30 tablet, Rfl: 6    doxycycline (VIBRA-TABS) 100 MG tablet, Take 1 tablet (100 mg total) by mouth 2 (two) times daily., Disp: 14 tablet, Rfl: 0   fluticasone furoate-vilanterol (BREO ELLIPTA) 100-25 MCG/INH AEPB, Inhale 1 puff into the lungs daily., Disp: 28 each, Rfl: 0   tiZANidine (ZANAFLEX) 4 MG tablet, Take 1 tablet (4 mg total) by mouth every 6 (six) hours as needed for muscle spasms., Disp: 30 tablet, Rfl: 2    Review of Systems  Constitutional:  Negative for activity change, appetite change, chills, diaphoresis, fatigue, fever and unexpected weight change.  HENT:  Negative for congestion, rhinorrhea, sinus pressure, sneezing, sore throat and trouble swallowing.   Eyes:  Negative for photophobia and visual disturbance.  Respiratory:  Negative for cough, chest tightness, shortness of breath, wheezing and stridor.   Cardiovascular:  Negative for chest pain, palpitations and leg swelling.  Gastrointestinal:  Negative for abdominal distention, abdominal pain, anal bleeding, blood in stool, constipation, diarrhea, nausea and vomiting.  Genitourinary:  Negative for difficulty urinating, dysuria, flank pain and hematuria.  Musculoskeletal:  Positive for myalgias and neck pain. Negative for arthralgias, back pain, gait problem and joint swelling.  Skin:  Negative for color change, pallor, rash and wound.  Neurological:  Negative for dizziness, tremors, weakness and light-headedness.  Hematological:  Negative for adenopathy. Does not bruise/bleed easily.  Psychiatric/Behavioral:  Negative for agitation, behavioral problems, confusion, decreased concentration, dysphoric mood and sleep disturbance.        Objective:   Physical Exam Constitutional:      Appearance: He is well-developed.  HENT:     Head: Normocephalic and atraumatic.  Eyes:     Conjunctiva/sclera: Conjunctivae normal.  Cardiovascular:     Rate and Rhythm: Normal rate and regular rhythm.  Pulmonary:     Effort: Pulmonary effort is normal. No  respiratory distress.     Breath sounds: No wheezing.  Abdominal:     General: There is no distension.     Palpations: Abdomen is soft.  Musculoskeletal:        General: No tenderness. Normal range of motion.     Cervical back: Normal range of motion and neck supple.  Skin:    General: Skin is warm and dry.     Coloration: Skin is not pale.     Findings: No erythema or rash.  Neurological:     General: No focal deficit present.     Mental Status: He is alert and oriented to person, place, and time.  Psychiatric:        Mood and Affect: Mood normal.  Behavior: Behavior normal.        Thought Content: Thought content normal.        Judgment: Judgment normal.           Assessment & Plan:   HIV disease:  I will add order HIV viral load CD4 count CBC with differential CMP, RPR GC and chlamydia and I will continue  Desean T Macho's  Dovato prescription which have sent to [facility] pharmacy to be mailed to him.  Neck pain upper back pain  I have ordered MRI without contrast and I have made a referral to sports medicine.  He may continue his albuterol if he needs a long-acting beta agonist with inhaled corticosteroid he should go on to that as well.  Vaccine counseling recommended COVID-19 and flu shots be given which he received today

## 2022-01-26 NOTE — Patient Instructions (Signed)
Watch video "All of the Rage Saved by [employer]" vs read online or book by Calla Kicks MD  Try Lynden Oxford  I am referring to sports medicine

## 2022-01-27 ENCOUNTER — Other Ambulatory Visit (HOSPITAL_COMMUNITY): Payer: Self-pay

## 2022-01-27 LAB — T-HELPER CELLS (CD4) COUNT (NOT AT ARMC)
CD4 % Helper T Cell: 41 % (ref 33–65)
CD4 T Cell Abs: 1400 /uL (ref 400–1790)

## 2022-01-28 NOTE — Progress Notes (Unsigned)
    [name] D.[facility] [address] [place] [address] Phone: [phone]   Assessment and Plan:     There are no diagnoses linked to this encounter.  ***   Pertinent previous records reviewed include ***   Follow Up: ***     Subjective:   I, David Mendez, am serving as a Neurosurgeon for Doctor David Mendez  Chief Complaint: neck pain  HPI:   01/31/2022 Patient is a 26 year old male complaining of neck pain. Patient states  Relevant Historical Information: ***  Additional pertinent review of systems negative.   Current Outpatient Medications:    albuterol (VENTOLIN HFA) 108 (90 Base) MCG/ACT inhaler, INHALE 2 PUFFS EVERY 4 HOURS AS NEEDED FOR WHEEZING FOR SHORTNESS OF BREATH, Disp: 18 g, Rfl: 0   cyclobenzaprine (FLEXERIL) 5 MG tablet, Take 1-2 tablets (5-10 mg total) by mouth 3 (three) times daily as needed for muscle spasms., Disp: 30 tablet, Rfl: 3   diclofenac (VOLTAREN) 75 MG EC tablet, Take 1 tablet (75 mg total) by mouth 2 (two) times daily. (Patient not taking: Reported on 12/21/2018), Disp: 30 tablet, Rfl: 2   dolutegravir-lamiVUDine (DOVATO) 50-300 MG tablet, Take 1 tablet by mouth daily. Please mail to patient, Disp: 30 tablet, Rfl: 11   doxycycline (VIBRA-TABS) 100 MG tablet, Take 1 tablet (100 mg total) by mouth 2 (two) times daily., Disp: 14 tablet, Rfl: 0   fluticasone furoate-vilanterol (BREO ELLIPTA) 100-25 MCG/INH AEPB, Inhale 1 puff into the lungs daily., Disp: 28 each, Rfl: 0   tiZANidine (ZANAFLEX) 4 MG tablet, Take 1 tablet (4 mg total) by mouth every 6 (six) hours as needed for muscle spasms., Disp: 30 tablet, Rfl: 2   Objective:     There were no vitals filed for this visit.    There is no height or weight on file to calculate BMI.    Physical Exam:    ***   Electronically signed by:  [name] D.[facility] 11:41 AM 01/28/22

## 2022-01-30 LAB — LIPID PANEL
Cholesterol: 231 mg/dL — ABNORMAL HIGH (ref <200)
HDL: 35 mg/dL — ABNORMAL LOW (ref >=40)
LDL Cholesterol (Calc): 161 mg/dL (calc) — ABNORMAL HIGH
Non-HDL Cholesterol (Calc): 196 mg/dL (calc) — ABNORMAL HIGH (ref <130)
Total CHOL/HDL Ratio: 6.6 (calc) — ABNORMAL HIGH (ref <5.0)
Triglycerides: 195 mg/dL — ABNORMAL HIGH (ref <150)

## 2022-01-30 LAB — COMPLETE METABOLIC PANEL WITH GFR
AG Ratio: 2 (calc) (ref 1.0–2.5)
ALT: 51 U/L — ABNORMAL HIGH (ref 9–46)
AST: 27 U/L (ref 10–40)
Albumin: 4.8 g/dL (ref 3.6–5.1)
Alkaline phosphatase (APISO): 77 U/L (ref 36–130)
BUN: 14 mg/dL (ref 7–25)
CO2: 27 mmol/L (ref 20–32)
Calcium: 9.5 mg/dL (ref 8.6–10.3)
Chloride: 106 mmol/L (ref 98–110)
Creat: 0.88 mg/dL (ref 0.60–1.24)
Globulin: 2.4 g/dL (calc) (ref 1.9–3.7)
Glucose, Bld: 92 mg/dL (ref 65–99)
Potassium: 4.2 mmol/L (ref 3.5–5.3)
Sodium: 142 mmol/L (ref 135–146)
Total Bilirubin: 0.7 mg/dL (ref 0.2–1.2)
Total Protein: 7.2 g/dL (ref 6.1–8.1)
eGFR: 121 mL/min/{1.73_m2} (ref >=60)

## 2022-01-30 LAB — CBC WITH DIFFERENTIAL/PLATELET
Absolute Monocytes: 395 cells/uL (ref 200–950)
Basophils Absolute: 38 cells/uL (ref 0–200)
Basophils Relative: 0.5 %
Eosinophils Absolute: 228 cells/uL (ref 15–500)
Eosinophils Relative: 3 %
HCT: 44.6 % (ref 38.5–50.0)
Hemoglobin: 15.9 g/dL (ref 13.2–17.1)
Lymphs Abs: 3519 cells/uL (ref 850–3900)
MCH: 32.4 pg (ref 27.0–33.0)
MCHC: 35.7 g/dL (ref 32.0–36.0)
MCV: 90.8 fL (ref 80.0–100.0)
MPV: 10.8 fL (ref 7.5–12.5)
Monocytes Relative: 5.2 %
Neutro Abs: 3420 cells/uL (ref 1500–7800)
Neutrophils Relative %: 45 %
Platelets: 223 10*3/uL (ref 140–400)
RBC: 4.91 10*6/uL (ref 4.20–5.80)
RDW: 12.9 % (ref 11.0–15.0)
Total Lymphocyte: 46.3 %
WBC: 7.6 10*3/uL (ref 3.8–10.8)

## 2022-01-30 LAB — HIV RNA, RTPCR W/R GT (RTI, PI,INT)
HIV 1 RNA Quant: <20 copies/mL — AB
HIV-1 RNA Quant, Log: <1.3 Log copies/mL — AB

## 2022-01-30 LAB — RPR: RPR Ser Ql: NONREACTIVE

## 2022-01-31 ENCOUNTER — Ambulatory Visit (INDEPENDENT_AMBULATORY_CARE_PROVIDER_SITE_OTHER): Payer: 59 | Admitting: Sports Medicine

## 2022-01-31 VITALS — BP 110/80 | HR 95 | Ht 66.0 in | Wt 194.0 lb

## 2022-01-31 DIAGNOSIS — M9903 Segmental and somatic dysfunction of lumbar region: Secondary | ICD-10-CM

## 2022-01-31 DIAGNOSIS — G8929 Other chronic pain: Secondary | ICD-10-CM

## 2022-01-31 DIAGNOSIS — S46812A Strain of other muscles, fascia and tendons at shoulder and upper arm level, left arm, initial encounter: Secondary | ICD-10-CM | POA: Diagnosis not present

## 2022-01-31 DIAGNOSIS — M546 Pain in thoracic spine: Secondary | ICD-10-CM

## 2022-01-31 DIAGNOSIS — M542 Cervicalgia: Secondary | ICD-10-CM | POA: Diagnosis not present

## 2022-01-31 DIAGNOSIS — M9901 Segmental and somatic dysfunction of cervical region: Secondary | ICD-10-CM

## 2022-01-31 DIAGNOSIS — S46811A Strain of other muscles, fascia and tendons at shoulder and upper arm level, right arm, initial encounter: Secondary | ICD-10-CM | POA: Diagnosis not present

## 2022-01-31 DIAGNOSIS — M9908 Segmental and somatic dysfunction of rib cage: Secondary | ICD-10-CM

## 2022-01-31 DIAGNOSIS — M9902 Segmental and somatic dysfunction of thoracic region: Secondary | ICD-10-CM

## 2022-01-31 DIAGNOSIS — M9905 Segmental and somatic dysfunction of pelvic region: Secondary | ICD-10-CM

## 2022-01-31 MED ORDER — MELOXICAM 15 MG PO TABS: 15.0000 mg | ORAL_TABLET | Freq: Every day | ORAL | 0 refills | Status: AC

## 2022-01-31 NOTE — Patient Instructions (Addendum)
Good to see you  - Start meloxicam 15 mg daily x2 weeks.  If still having pain after 2 weeks, complete 3rd-week of meloxicam. May use remaining meloxicam as needed once daily for pain control.  Do not to use additional NSAIDs while taking meloxicam.  May use Tylenol 500-1000 mg 2 to 3 times a day for breakthrough pain. Neck HEP 3-4 week follow up  

## 2022-02-02 ENCOUNTER — Other Ambulatory Visit (HOSPITAL_COMMUNITY): Payer: Self-pay

## 2022-02-03 ENCOUNTER — Ambulatory Visit (HOSPITAL_COMMUNITY): Payer: 59

## 2022-02-07 ENCOUNTER — Other Ambulatory Visit (HOSPITAL_COMMUNITY): Payer: Self-pay

## 2022-02-09 ENCOUNTER — Ambulatory Visit: Payer: 59 | Admitting: Physical Therapy

## 2022-02-09 NOTE — Therapy (Deleted)
OUTPATIENT PHYSICAL THERAPY SHOULDER EVALUATION   Patient Name: David Mendez MRN: [id number] DOB:1995/09/05, 26 y.o., male Today's Date: 02/09/2022    Past Medical History:  Diagnosis Date   Acute pain of left shoulder 01/11/2017   Allergy    Asthma    Asthma, chronic 08/06/2014   Chlamydia 06/08/2015   History of giardia infection    HIV antibody positive (HCC)    HIV disease (HCC) 08/06/2014   Industrial dermatitis    Inguinal lymphadenitis 07/12/2017   Loss of weight    Past Surgical History:  Procedure Laterality Date   WISDOM TOOTH EXTRACTION     Patient Active Problem List   Diagnosis Date Noted   Inguinal lymphadenitis 07/12/2017   Chronic left shoulder pain 01/13/2017   Low HDL (under 40) 07/07/2015   Allergic contact dermatitis 11/09/2014   Chronic constipation 11/09/2014   History of gonorrhea 11/09/2014   History of giardia infection 11/09/2014   HIV disease (HCC) 08/06/2014   Asthma, mild intermittent 08/06/2014   Seasonal allergic rhinitis 08/31/2006    PCP: David Cory, MD  REFERRING PROVIDER: Richardean Sale, DO  THERAPY DIAG:  No diagnosis found.  REFERRING DIAG: Neck pain [M54.2], Chronic bilateral thoracic back pain [M54.6, G89.29]   Rationale for Evaluation and Treatment:  Rehabilitation  SUBJECTIVE:  PERTINENT PAST HISTORY:  HIV        PRECAUTIONS: None and Other: HIV  WEIGHT BEARING RESTRICTIONS No  FALLS:  Has patient fallen in last 6 months? No, Number of falls: ***  MOI/History of condition:  Onset date: Chronic  SUBJECTIVE STATEMENT  David Mendez is a 26 y.o. male who presents to clinic with chief complaint of ***.  ***  From referring provider:   "1. Neck pain 2. Chronic bilateral thoracic back pain 3. Strain of left trapezius muscle, initial encounter 4. Trapezius muscle strain, right, initial encounter 5. Somatic dysfunction of cervical region 6. Somatic dysfunction of thoracic region 7. Somatic  dysfunction of lumbar region 8. Somatic dysfunction of pelvic region 9. Somatic dysfunction of rib region -Chronic with exacerbation, initial sports medicine visit - Most consistent with musculoskeletal strains localized primarily in bilateral trapezius, cervical paraspinal, thoracic paraspinal, rhomboids, levator.  Likely flared by patient's work from home on computer - Recommend starting HEP and physical therapy for neck, thoracic spine, trapezius - Start meloxicam 15 mg daily x2 weeks.  If still having pain after 2 weeks, complete 3rd-week of meloxicam. May use remaining meloxicam as needed once daily for pain control.  Do not to use additional NSAIDs while taking meloxicam.  May use Tylenol [phone] mg 2 to 3 times a day for breakthrough pain. - Patient elected for initial OMT today.  Tolerated well per note below. - Decision today to treat with OMT was based on Physical Exam   After verbal consent patient was treated with HVLA (high velocity low amplitude), ME (muscle energy), FPR (flex positional release), ST (soft tissue), PC/PD (Pelvic Compression/ Pelvic Decompression) techniques in cervical, rib, thoracic, lumbar, and pelvic areas. Patient tolerated the procedure well with improvement in symptoms.  Patient educated on potential side effects of soreness and recommended to rest, hydrate, and use Tylenol as needed for pain control."   Red flags:  {has/denies:26543} {kerredflag:26542}  Pain:  Are you having pain? {yes/no:20286} Pain location: *** NPRS scale:  {NUMBERS; 0-10:5044}/10 to {NUMBERS; 0-10:5044}/10 Aggravating factors: *** Relieving factors: *** Pain description: {PAIN DESCRIPTION:21022940} Stage: {Desc; acute/subacute/chronic:13799} Stability: {kerbetterworse:26715} 24 hour pattern: ***   Occupation: ***  Assistive  Device: ***  Hand Dominance: ***  Patient Goals/Specific Activities: ***   OBJECTIVE:   DIAGNOSTIC FINDINGS:  2019 MRI   IMPRESSION: Negative  MRI cervical spine   GENERAL OBSERVATION:  ***     SENSATION:  Light touch: {intact/deficits:24005}   PALPATION: ***  Cervical ROM  ROM ROM  02/09/2022  Flexion ***  Extension ***  Right lateral flexion ***  Left lateral flexion ***  Right rotation ***  Left rotation ***  Flexion rotation (normal is 30 degrees)   Flexion rotation (normal is 30 degrees)     (Blank rows = not tested, N = WNL, * = concordant pain)  UPPER EXTREMITY MMT:  MMT Right 02/09/2022 Left 02/09/2022  Shoulder flexion    Shoulder abduction (C5)    Shoulder ER    Shoulder IR    Middle trapezius    Lower trapezius    Shoulder extension    Grip strength    Cervical flexion (C1,C2)    Cervical S/B (C3)    Shoulder shrug (C4)    Elbow flexion (C6)    Elbow ext (C7)    Thumb ext (C8)    Finger abd (T1)    Grossly     (Blank rows = not tested, score listed is out of 5 possible points.  N = WNL, D = diminished, C = clear for gross weakness with myotome testing, * = concordant pain with testing)   SPECIAL TESTS:  Spurlings: *** Distraction: *** ULTT-A: ***  JOINT MOBILITY TESTING:  ***  PATIENT SURVEYS:  {rehab surveys:24030:a}    TODAY'S TREATMENT:  Creating, reviewing, and completing below HEP    PATIENT EDUCATION:  POC, diagnosis, prognosis, HEP, and outcome measures.  Pt educated via explanation, demonstration, and handout (HEP).  Pt confirms understanding verbally.    HOME EXERCISE PROGRAM: ***  ASTERISK SIGNS   Asterisk Signs Eval (02/09/2022)                                                 ASSESSMENT:  CLINICAL IMPRESSION: David Mendez is a 26 y.o. male who presents to clinic with signs and sxs consistent with ***.    OBJECTIVE IMPAIRMENTS: Pain, ***  ACTIVITY LIMITATIONS: ***  PERSONAL FACTORS: See medical history and pertinent history   REHAB POTENTIAL: {rehabpotential:25112}  CLINICAL DECISION MAKING: {clinical decision making:25114}  EVALUATION  COMPLEXITY: {Evaluation complexity:25115}   GOALS:   SHORT TERM GOALS: Target date: 03/09/2022  [name] will be >75% HEP compliant to improve carryover between sessions and facilitate independent management of condition  Evaluation (02/09/2022): ongoing Goal status: INITIAL   LONG TERM GOALS: Target date: 04/06/2022  ***   2.  ***   3.  ***   4.  ***   5.  ***   6.  ***   PLAN: PT FREQUENCY: 1-2x/week  PT DURATION: 8 weeks (Ending 04/06/2022)  PLANNED INTERVENTIONS: Therapeutic exercises, Aquatic therapy, Therapeutic activity, Neuro Muscular re-education, Gait training, Patient/Family education, Joint mobilization, Dry Needling, Electrical stimulation, Spinal mobilization and/or manipulation, Moist heat, Taping, Vasopneumatic device, Ionotophoresis 4mg /ml Dexamethasone, and Manual therapy  PLAN FOR NEXT SESSION: ***   PT, DPT 02/09/2022, 10:21 AM

## 2022-02-11 ENCOUNTER — Other Ambulatory Visit (HOSPITAL_COMMUNITY): Payer: Self-pay

## 2022-02-13 ENCOUNTER — Other Ambulatory Visit: Payer: Self-pay | Admitting: Infectious Disease

## 2022-02-13 DIAGNOSIS — J4540 Moderate persistent asthma, uncomplicated: Secondary | ICD-10-CM

## 2022-02-14 NOTE — Telephone Encounter (Signed)
Ok to refill. Attempted to contact patient to confirm if he has a PCP to take over medication. No answer. Will also send patient a Mychart message.

## 2022-02-22 ENCOUNTER — Other Ambulatory Visit: Payer: Self-pay

## 2022-02-22 DIAGNOSIS — J4540 Moderate persistent asthma, uncomplicated: Secondary | ICD-10-CM

## 2022-02-22 MED ORDER — ALBUTEROL SULFATE HFA 108 (90 BASE) MCG/ACT IN AERS: 2.0000 | INHALATION_SPRAY | RESPIRATORY_TRACT | 3 refills | Status: DC | PRN

## 2022-02-25 NOTE — Progress Notes (Deleted)
   [facility] Mendez [facility] [address] [place] [address] Phone: [phone]   Assessment and Plan:     There are no diagnoses linked to this encounter.  *** - Patient has received significant relief with OMT in the past.  Elects for repeat OMT today.  Tolerated well per note below. - Decision today to treat with OMT was based on Physical Exam   After verbal consent patient was treated with HVLA (high velocity low amplitude), ME (muscle energy), FPR (flex positional release), ST (soft tissue), PC/PD (Pelvic Compression/ Pelvic Decompression) techniques in cervical, rib, thoracic, lumbar, and pelvic areas. Patient tolerated the procedure well with improvement in symptoms.  Patient educated on potential side effects of soreness and recommended to rest, hydrate, and use Tylenol as needed for pain control.   Pertinent previous records reviewed include ***   Follow Up: ***     Subjective:   I, David Mendez, am serving as a Neurosurgeon for Doctor David Mendez   Chief Complaint: neck pain   HPI:    01/31/2022 Patient is a 26 year old male complaining of neck pain. 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  Current Outpatient Medications  Medication Sig Dispense Refill   albuterol (VENTOLIN HFA) 108 (90 Base) MCG/ACT inhaler Inhale 2 puffs into the lungs every 4 (four) hours as needed for wheezing or  shortness of breath. 8.5 each 3   dolutegravir-lamiVUDine (DOVATO) 50-300 MG tablet Take 1 tablet by mouth daily. Please mail to patient 30 tablet 11   meloxicam (MOBIC) 15 MG tablet Take 1 tablet (15 mg total) by mouth daily. 30 tablet 0   No current facility-administered medications for this visit.      Objective:     There were no vitals filed for this visit.    There is no height or weight on file to calculate BMI.    Physical Exam:     General: Well-appearing, cooperative, sitting comfortably in no acute distress.   OMT Physical Exam:  ASIS Compression Test: Positive Right Cervical: TTP paraspinal, *** Rib: Bilateral elevated first rib with TTP Thoracic: TTP paraspinal,*** Lumbar: TTP paraspinal,*** Pelvis: Right anterior innominate  Electronically signed by:  David Mendez [facility] 7:40 AM 02/25/22

## 2022-02-28 ENCOUNTER — Ambulatory Visit: Payer: 59 | Admitting: Sports Medicine

## 2022-03-01 NOTE — Progress Notes (Unsigned)
    David Mendez D.[facility] [address] [place] [address] Phone: [phone]   Assessment and Plan:     There are no diagnoses linked to this encounter.  ***   Pertinent previous records reviewed include ***   Follow Up: ***     Subjective:   I, David Mendez, am serving as a Neurosurgeon for Doctor David Mendez   Chief Complaint: neck pain   HPI:    01/31/2022 Patient is a 26 year old male complaining of neck pain. 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   Current Outpatient Medications:    albuterol (VENTOLIN HFA) 108 (90 Base) MCG/ACT inhaler, Inhale 2 puffs into the lungs every 4 (four) hours as needed for wheezing or shortness of breath., Disp: 8.5 each, Rfl: 3   dolutegravir-lamiVUDine (DOVATO) 50-300 MG tablet, Take 1 tablet by mouth daily. Please mail to patient, Disp: 30 tablet, Rfl: 11   meloxicam (MOBIC) 15 MG tablet, Take 1 tablet (15 mg total) by mouth daily., Disp: 30 tablet, Rfl: 0   Objective:     There were no vitals filed for this visit.    There is no height or weight on file to calculate BMI.    Physical Exam:    ***   Electronically signed by:  David Mendez [facility] 12:52 PM 03/01/22

## 2022-03-02 ENCOUNTER — Ambulatory Visit (INDEPENDENT_AMBULATORY_CARE_PROVIDER_SITE_OTHER): Payer: 59 | Admitting: Sports Medicine

## 2022-03-02 VITALS — BP 120/86 | HR 96 | Ht 66.0 in | Wt 193.0 lb

## 2022-03-02 DIAGNOSIS — S46811D Strain of other muscles, fascia and tendons at shoulder and upper arm level, right arm, subsequent encounter: Secondary | ICD-10-CM | POA: Diagnosis not present

## 2022-03-02 DIAGNOSIS — M546 Pain in thoracic spine: Secondary | ICD-10-CM | POA: Diagnosis not present

## 2022-03-02 DIAGNOSIS — S46812D Strain of other muscles, fascia and tendons at shoulder and upper arm level, left arm, subsequent encounter: Secondary | ICD-10-CM | POA: Diagnosis not present

## 2022-03-02 DIAGNOSIS — M542 Cervicalgia: Secondary | ICD-10-CM | POA: Diagnosis not present

## 2022-03-02 DIAGNOSIS — G8929 Other chronic pain: Secondary | ICD-10-CM

## 2022-03-02 MED ORDER — CYCLOBENZAPRINE HCL 5 MG PO TABS: 5.0000 mg | ORAL_TABLET | Freq: Every day | ORAL | 0 refills | Status: AC

## 2022-03-02 NOTE — Patient Instructions (Addendum)
Good to see you Discontinue meloxicam Start flexeril 5-10 mg nightly as needed for muscle spasm  Recommend starting PT  Continue HEP  4 week follow up

## 2022-03-03 ENCOUNTER — Other Ambulatory Visit (HOSPITAL_COMMUNITY): Payer: Self-pay

## 2022-03-17 ENCOUNTER — Other Ambulatory Visit (HOSPITAL_COMMUNITY): Payer: Self-pay

## 2022-03-22 ENCOUNTER — Other Ambulatory Visit (HOSPITAL_COMMUNITY): Payer: Self-pay

## 2022-03-24 ENCOUNTER — Other Ambulatory Visit (HOSPITAL_COMMUNITY): Payer: Self-pay

## 2022-03-29 ENCOUNTER — Other Ambulatory Visit: Payer: Self-pay | Admitting: Sports Medicine

## 2022-03-29 NOTE — Progress Notes (Deleted)
   David Mendez D.[facility] Phone: [phone]   Assessment and Plan:     There are no diagnoses linked to this encounter.  *** - Patient has received significant relief with OMT in the past.  Elects for repeat OMT today.  Tolerated well per note below. - Decision today to treat with OMT was based on Physical Exam   After verbal consent patient was treated with HVLA (high velocity low amplitude), ME (muscle energy), FPR (flex positional release), ST (soft tissue), PC/PD (Pelvic Compression/ Pelvic Decompression) techniques in cervical, rib, thoracic, lumbar, and pelvic areas. Patient tolerated the procedure well with improvement in symptoms.  Patient educated on potential side effects of soreness and recommended to rest, hydrate, and use Tylenol as needed for pain control.   Pertinent previous records reviewed include ***   Follow Up: ***     Subjective:   I, David Mendez, am serving as a Education administrator for Doctor David Mendez   Chief Complaint: neck pain   HPI:    01/31/2022 Patient is a 27 year old male complaining of neck pain. 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  Current Outpatient Medications  Medication Sig Dispense Refill   albuterol (VENTOLIN HFA) 108 (90  Base) MCG/ACT inhaler Inhale 2 puffs into the lungs every 4 (four) hours as needed for wheezing or shortness of breath. 8.5 each 3   cyclobenzaprine (FLEXERIL) 5 MG tablet Take 1 tablet (5 mg total) by mouth at bedtime. 30 tablet 0   dolutegravir-lamiVUDine (DOVATO) 50-300 MG tablet Take 1 tablet by mouth daily. Please mail to patient 30 tablet 11   meloxicam (MOBIC) 15 MG tablet Take 1 tablet (15 mg total) by mouth daily. 30 tablet 0   No current facility-administered medications for this visit.      Objective:     There were no vitals filed for this visit.    There is no height or weight on file to calculate BMI.    Physical Exam:     General: Well-appearing, cooperative, sitting comfortably in no acute distress.   OMT Physical Exam:  ASIS Compression Test: Positive Right Cervical: TTP paraspinal, *** Rib: Bilateral elevated first rib with TTP Thoracic: TTP paraspinal,*** Lumbar: TTP paraspinal,*** Pelvis: Right anterior innominate  Electronically signed by:  David Mendez [facility] 12:36 PM 03/29/22

## 2022-03-30 ENCOUNTER — Ambulatory Visit: Payer: 59 | Admitting: Sports Medicine

## 2022-04-01 ENCOUNTER — Other Ambulatory Visit (HOSPITAL_COMMUNITY): Payer: Self-pay

## 2022-04-08 ENCOUNTER — Other Ambulatory Visit (HOSPITAL_COMMUNITY): Payer: Self-pay

## 2022-04-11 ENCOUNTER — Other Ambulatory Visit (HOSPITAL_COMMUNITY): Payer: Self-pay

## 2022-05-03 ENCOUNTER — Other Ambulatory Visit (HOSPITAL_COMMUNITY): Payer: Self-pay

## 2022-05-06 ENCOUNTER — Other Ambulatory Visit (HOSPITAL_COMMUNITY): Payer: Self-pay

## 2022-05-10 ENCOUNTER — Other Ambulatory Visit (HOSPITAL_COMMUNITY): Payer: Self-pay

## 2022-06-03 ENCOUNTER — Other Ambulatory Visit: Payer: Self-pay

## 2022-06-06 ENCOUNTER — Other Ambulatory Visit (HOSPITAL_COMMUNITY): Payer: Self-pay

## 2022-07-01 ENCOUNTER — Other Ambulatory Visit: Payer: Self-pay

## 2022-07-04 ENCOUNTER — Other Ambulatory Visit: Payer: Self-pay

## 2022-07-06 ENCOUNTER — Other Ambulatory Visit: Payer: Self-pay

## 2022-07-20 ENCOUNTER — Ambulatory Visit: Payer: Self-pay | Admitting: Infectious Disease

## 2022-07-23 ENCOUNTER — Other Ambulatory Visit (HOSPITAL_COMMUNITY): Payer: Self-pay

## 2022-07-25 ENCOUNTER — Encounter: Payer: Self-pay | Admitting: Infectious Disease

## 2022-07-25 ENCOUNTER — Other Ambulatory Visit (HOSPITAL_COMMUNITY): Payer: Self-pay

## 2022-07-25 NOTE — Telephone Encounter (Signed)
Called patient regarding refill request. States that he has appointment with new PCP on Wednesday. Advised pt that he should call PCP office or be seen by Urgent care for evaluation and refill. Verbalized understanding Juanita Laster, RMA

## 2022-08-29 ENCOUNTER — Other Ambulatory Visit (HOSPITAL_COMMUNITY): Payer: Self-pay

## 2022-08-30 DIAGNOSIS — J4540 Moderate persistent asthma, uncomplicated: Secondary | ICD-10-CM

## 2022-08-30 DIAGNOSIS — B20 Human immunodeficiency virus [HIV] disease: Secondary | ICD-10-CM

## 2022-09-14 ENCOUNTER — Other Ambulatory Visit: Payer: Self-pay

## 2022-09-14 ENCOUNTER — Telehealth: Payer: Self-pay

## 2022-09-14 DIAGNOSIS — B20 Human immunodeficiency virus [HIV] disease: Secondary | ICD-10-CM

## 2022-09-14 MED ORDER — DOVATO 50-300 MG PO TABS
1.0000 | ORAL_TABLET | Freq: Every day | ORAL | 1 refills | Status: AC
Start: 2022-09-14 — End: ?

## 2022-09-14 MED ORDER — ALBUTEROL SULFATE HFA 108 (90 BASE) MCG/ACT IN AERS
2.0000 | INHALATION_SPRAY | RESPIRATORY_TRACT | 1 refills | Status: AC | PRN
Start: 2022-09-14 — End: ?

## 2022-09-14 NOTE — Telephone Encounter (Signed)
Patient transferring care to [facility] in [place], [place]. Patient has signed a records release as well. I have faxed patient's most recent lab results, referral and OV notes, so hopefully he can get into care soon. Patient aware of all information. Sarahlynn Cisnero T Pricilla Loveless

## 2023-06-06 ENCOUNTER — Telehealth: Payer: Self-pay

## 2023-06-06 NOTE — Telephone Encounter (Signed)
 Attempted to call pt in response to MyChart Appt Req, but no answer and left a VM. Also sent a MyChart msg to pt as well. Pt called back for scheduling.   Pt stated in the appt req that he has new coverage and unable to get refills due to the cost or non-coverage. I did verify with him if he was currently residing in [place], for our previous communication was having him transfer care to [facility]. He did confirm that he is living in [place] and cannot recall if there was a possibly one visit completed with them.   I have updated pt's address and he is set to see financial and the provider for a f/u regarding medication.

## 2023-06-08 ENCOUNTER — Ambulatory Visit: Payer: Self-pay

## 2023-06-08 ENCOUNTER — Ambulatory Visit: Payer: Self-pay | Admitting: Infectious Disease

## 2023-08-01 ENCOUNTER — Other Ambulatory Visit: Payer: Self-pay | Admitting: Infectious Disease

## 2024-03-22 ENCOUNTER — Other Ambulatory Visit: Payer: Self-pay | Admitting: Infectious Disease

## 2024-03-22 DIAGNOSIS — J4540 Moderate persistent asthma, uncomplicated: Secondary | ICD-10-CM
